# Patient Record
Sex: Male | Born: 1940 | Race: White | Hispanic: No | Marital: Married | Smoking: Former smoker
Health system: Southern US, Community
[De-identification: ages and names within clinical notes are randomized; demographics above are authoritative.]

## PROBLEM LIST (undated history)

## (undated) DIAGNOSIS — I10 Essential (primary) hypertension: Secondary | ICD-10-CM

## (undated) DIAGNOSIS — M8580 Other specified disorders of bone density and structure, unspecified site: Secondary | ICD-10-CM

## (undated) DIAGNOSIS — I2510 Atherosclerotic heart disease of native coronary artery without angina pectoris: Secondary | ICD-10-CM

## (undated) DIAGNOSIS — K219 Gastro-esophageal reflux disease without esophagitis: Secondary | ICD-10-CM

## (undated) DIAGNOSIS — E785 Hyperlipidemia, unspecified: Secondary | ICD-10-CM

## (undated) HISTORY — DX: Hyperlipidemia, unspecified: E78.5

## (undated) HISTORY — DX: Gastro-esophageal reflux disease without esophagitis: K21.9

## (undated) HISTORY — DX: Atherosclerotic heart disease of native coronary artery without angina pectoris: I25.10

## (undated) HISTORY — DX: Essential (primary) hypertension: I10

## (undated) HISTORY — PX: HERNIA REPAIR: SHX51

## (undated) HISTORY — DX: Other specified disorders of bone density and structure, unspecified site: M85.80

---

## 1999-10-18 ENCOUNTER — Ambulatory Visit (HOSPITAL_COMMUNITY): Admission: RE | Admit: 1999-10-18 | Discharge: 1999-10-18 | Payer: Self-pay | Admitting: Internal Medicine

## 1999-10-18 ENCOUNTER — Encounter: Payer: Self-pay | Admitting: Internal Medicine

## 1999-12-13 ENCOUNTER — Other Ambulatory Visit: Admission: RE | Admit: 1999-12-13 | Discharge: 1999-12-13 | Payer: Self-pay | Admitting: Gastroenterology

## 1999-12-13 ENCOUNTER — Encounter (INDEPENDENT_AMBULATORY_CARE_PROVIDER_SITE_OTHER): Payer: Self-pay

## 2003-04-28 ENCOUNTER — Ambulatory Visit (HOSPITAL_COMMUNITY): Admission: RE | Admit: 2003-04-28 | Discharge: 2003-04-28 | Payer: Self-pay | Admitting: *Deleted

## 2003-06-26 ENCOUNTER — Encounter: Payer: Self-pay | Admitting: General Surgery

## 2003-06-26 ENCOUNTER — Ambulatory Visit (HOSPITAL_COMMUNITY): Admission: RE | Admit: 2003-06-26 | Discharge: 2003-06-26 | Payer: Self-pay | Admitting: General Surgery

## 2005-03-01 ENCOUNTER — Encounter: Admission: RE | Admit: 2005-03-01 | Discharge: 2005-03-01 | Payer: Self-pay | Admitting: General Surgery

## 2005-03-03 ENCOUNTER — Encounter: Admission: RE | Admit: 2005-03-03 | Discharge: 2005-03-03 | Payer: Self-pay | Admitting: General Surgery

## 2005-03-07 ENCOUNTER — Ambulatory Visit (HOSPITAL_COMMUNITY): Admission: RE | Admit: 2005-03-07 | Discharge: 2005-03-07 | Payer: Self-pay | Admitting: General Surgery

## 2005-03-07 ENCOUNTER — Ambulatory Visit (HOSPITAL_BASED_OUTPATIENT_CLINIC_OR_DEPARTMENT_OTHER): Admission: RE | Admit: 2005-03-07 | Discharge: 2005-03-07 | Payer: Self-pay | Admitting: General Surgery

## 2009-11-02 ENCOUNTER — Encounter: Admission: RE | Admit: 2009-11-02 | Discharge: 2009-11-02 | Payer: Self-pay | Admitting: Internal Medicine

## 2009-12-09 ENCOUNTER — Encounter: Admission: RE | Admit: 2009-12-09 | Discharge: 2009-12-09 | Payer: Self-pay | Admitting: Endocrinology

## 2009-12-09 ENCOUNTER — Other Ambulatory Visit: Admission: RE | Admit: 2009-12-09 | Discharge: 2009-12-09 | Payer: Self-pay | Admitting: Diagnostic Radiology

## 2011-02-18 NOTE — Op Note (Signed)
   NAMEKOLTON, Kevin Le                        ACCOUNT NO.:  [id number]   MEDICAL RECORD NO.:  [id number]                   PATIENT TYPE:  AMB   LOCATION:  ENDO                                 FACILITY:  [facility]   PHYSICIAN:  Georgiana Spinner, M.D.                 DATE OF BIRTH:  01/21/1941   DATE OF PROCEDURE:  04/28/2003  DATE OF DISCHARGE:                                 OPERATIVE REPORT   PROCEDURE:  Colonoscopy.   INDICATIONS:  Colon cancer screening.   ANESTHESIA:  None further given.   DESCRIPTION OF PROCEDURE:  With the patient mildly sedated in the left  lateral decubitus position, the Olympus videoscopic colonoscope was inserted  in the rectum after a normal rectal exam and passed under direct vision to  the cecum, identified by ileocecal valve and appendiceal orifice, both of  which were photographed.  From this point the colonoscope was slowly  withdrawn, taking circumferential views of the entire colonic mucosa,  stopping only in the rectum, which appeared normal on direct and retroflexed  view.  The endoscope was straightened and withdrawn.  The patient's vital  signs and pulse oximetry remained stable.  The patient tolerated the  procedure well without apparent complications.   FINDINGS:  Unremarkable colonoscopic examination to the cecum.   PLAN:  Repeat examination in five to 10 years.                                               Georgiana Spinner, M.D.    GMO/MEDQ  D:  04/28/2003  T:  04/28/2003  Job:  [id number]

## 2011-02-18 NOTE — Op Note (Signed)
Kevin Le, Kevin Le                        ACCOUNT NO.:  [id number]   MEDICAL RECORD NO.:  [id number]                   PATIENT TYPE:  AMB   LOCATION:  DAY                                  FACILITY:  [facility]   PHYSICIAN:  Adolph Pollack, M.D.            DATE OF BIRTH:  [birth year]   DATE OF PROCEDURE:  06/26/2003  DATE OF DISCHARGE:                                 OPERATIVE REPORT   PREOPERATIVE DIAGNOSIS:  Bilateral recurrent inguinal hernias.   POSTOPERATIVE DIAGNOSIS:  Bilateral recurrent inguinal hernias.   PROCEDURE:  Laparoscopic repair of bilateral recurrent inguinal hernias with  mesh.   SURGEON:  Adolph Pollack, M.D.   ASSISTANT:  Anselm Pancoast. Zachery Dakins, M.D.   ANESTHESIA:  General.   INDICATION:  Kevin Le is a 70 year old male, who on separate occasions  has had both right and left inguinal hernias repaired.  He has been having  some left-sided bulge and also now right-sided stinging and swelling.  On  exam, he has bilateral recurrent hernias and now presents for laparoscopic  repair.  The procedure and the risks were discussed with him preoperatively.   TECHNIQUE:  He is seen in the holding area, then brought to the operating  room, placed supine on the operating table, and a general anesthetic was  administered.  A Foley catheter was placed in the bladder.  The groin and  lower abdomen was shaved then sterilely prepped and draped.  Local  anesthetic consisting of dilute Marcaine was infiltrated in the subumbilical  region, and a small transverse subumbilical incision made through the skin  and subcutaneous tissue.  Using blunt dissection, I then identified the left  anterior rectus sheath and made a small incision it.  The underlying left  rectus muscle was then swept laterally, exposing the posterior rectus  sheath.  A balloon dissection device was then placed in the extraperitoneal  space under laparoscopic vision.  Balloon dissection was performed  adequately.  Once this was completed, the balloon was removed, and a trocar  was placed into the extraperitoneal space.  CO2 gas was then insufflated  into the extraperitoneal space, creating a working area.  Under laparoscopic  vision, two 5 mm trocars were placed in the lower midline.  I began on the  left side by identifying Cooper's ligament and the direct space which  appeared to be solid.  I dissected fibrofatty attachments from the anterior  and lateral abdominal wall to the level of the umbilicus.  I then identified  the spermatic cord and noticed an indirect sac and a defect in the internal  ring.  Using careful blunt dissection, I dissected the sac away from the  cord back to the level of the umbilicus, exposing the internal ring defect.   Next, I approached the right extraperitoneal space.  I isolated Cooper's  ligament using blunt dissection as well and then identified the indirect  space which appeared  to be solid.  I then used blunt dissection to dissect  the fibrofatty attachments away from the lateral and anterior abdominal  walls up to the level of the umbilicus.  I identified the spermatic cord and  noticed again another indirect sac going up through a small internal ring  defect.  Using careful blunt dissection, I was able to free the sac up from  the cord and dissect it back to the level of the umbilicus.  Following this,  I placed a piece of 5 inch x 6 inch polypropylene mesh with a partial  longitudinal slit cut into it into the right extraperitoneal space and  positioned it to the two tails were wrapped around the cord.  I then  anchored the mesh to Cooper's ligament and the anterior and lateral  abdominal wall with spiral tacks.  I crossed the two tails around the cord,  creating a new internal ring.  This provided for more than adequate coverage  of the direct, indirect, and femoral spaces.   In a similar fashion, I placed a piece of 5 x 6 inch polypropylene mesh  with  a partial longitudinal slit cut in, into the left extraperitoneal space and  positioned it.  I then anchored it to Cooper's ligament, the anterior and  lateral abdominal wall with the spiral tacker.  I then crossed the two tails  and actually used one tack to anchor the two tails to each other and to the  anterior abdominal wall.  This again provided for more than adequate  coverage of the direct, indirect, and femoral spaces.   Following this, hemostasis appeared to be adequate.  I used the blunt  instrument to hold pressure on the inferolateral aspect of each piece of  mesh, then released the CO2 gas from the extraperitoneal space.  I then  removed the instruments and trocars.   I repaired the left anterior rectus fascial defect with interrupted 0 Vicryl  sutures.  The skin incisions were closed with 4-0 Monocryl subcuticular  stitches.  Steri-Strips and sterile dressings were applied.   He tolerated the procedure well without any apparent complications.  He was  subsequently taken to the recovery room in satisfactory condition.  He will  be given discharge instructions along with pain medicine, and he will be  discharged home today.                                               Adolph Pollack, M.D.    Kari Baars  D:  06/26/2003  T:  06/26/2003  Job:  [id number]   cc:   Janae Bridgeman. Eloise Harman., M.D.  [address]  [place]  [place] [address]  Fax: [phone]

## 2011-02-18 NOTE — Op Note (Signed)
   [name], Kevin Le                        ACCOUNT NO.:  [id number]   MEDICAL RECORD NO.:  [id number]                   PATIENT TYPE:  AMB   LOCATION:  ENDO                                 FACILITY:  [facility]   PHYSICIAN:  Georgiana Spinner, M.D.                 DATE OF BIRTH:  30-Dec-1940   DATE OF PROCEDURE:  DATE OF DISCHARGE:                                 OPERATIVE REPORT   PROCEDURE:  Endoscopy.   INDICATIONS FOR PROCEDURE:  Gastroesophageal reflux disease.   ANESTHESIA:  Demerol 50, Versed 4 mg.   DESCRIPTION OF PROCEDURE:  With the patient mildly sedated in the left  lateral decubitus position, the Olympus videoscopic endoscope was inserted  in the mouth and passed under direct vision through the esophagus which  appeared normal until we reached the distal esophagus which appeared  somewhat inflamed, photographs were taken. The stomach, fundus, body,  antrum, duodenal bulb and second portion of the duodenum were visualized and  from this point, the endoscope was slowly withdrawn taking circumferential  views of the duodenal mucosa until the endoscope was then pulled back into  the stomach, placed in retroflexion to view the stomach from below and an  incomplete wrap of the GE junction around the endoscope was noted and  photographed. The endoscope was straightened and withdrawn taking  circumferential views of the remaining gastric and esophageal mucosa. The  patient's vital signs and pulse oximeter remained stable. The patient  tolerated the procedure well without apparent complications.   FINDINGS:  Inflammatory changes of the GE junction, no evidence of Barrett's  seen. Some blood streaking in the stomach was noted presumably from his  inflamed esophageal junction.   PLAN:  Treat reflux as needed and proceed to colonoscopy as planned.                                               Georgiana Spinner, M.D.    GMO/MEDQ  D:  04/28/2003  T:  04/28/2003  Job:  [id number]

## 2011-02-18 NOTE — Op Note (Signed)
NAMEKOHEN, REITHER              ACCOUNT NO.:  [id number]   MEDICAL RECORD NO.:  [id number]          PATIENT TYPE:  AMB   LOCATION:  [facility]                         FACILITY:  [facility]   PHYSICIAN:  Adolph Pollack, M.D.DATE OF BIRTH:  1941/06/07   DATE OF PROCEDURE:  03/07/2005  DATE OF DISCHARGE:                                 OPERATIVE REPORT   PREOPERATIVE DIAGNOSIS:  Recurrent left inguinal hernia.   POSTOPERATIVE DIAGNOSIS:  Recurrent left inguinal hernia.   PROCEDURE:  Repair of recurrent left inguinal hernia with plug and mesh.   SURGEON:  Adolph Pollack, M.D.   ANESTHESIA:  General with local 0.5% plain Marcaine.   INDICATIONS FOR PROCEDURE:  Kevin Le is a 70 year old male.  On June 26, 2003, he underwent laparoscopic bilateral recurrent inguinal herniae  repair with mesh.  He was back to work within five days.  About 3-4 months  ago, he noticed a little bubble in the lateral aspect of the previous left  groin incision that he has had to push back in.  He does hear some gurgling.  On exam, he does have a recurrent left inguinal hernia and he now presents  for repair.  The procedure and risks were discussed with him preoperatively.  The importance of very light activity postoperatively was also discussed  with him.   SURGICAL TECHNIQUE:  He is seen in the holding area and the left groin  marked with my initials.  He is then brought to the operating room and  placed supine on the operating table and general anesthetic was  administered.  The hair on the left groin area was clipped.  The area was  sterilely prepped and draped.  A local anesthetic was infiltrated  superficially and deep in the are of the previous incision and I partially  excised part of the previous incision.  The subcutaneous tissue was divided  sharply down to what was attenuated external oblique aponeurosis.  More  local anesthetic was infiltrated deep to this and I divided this sharply.   I  then used sharp dissection to separate the internal oblique muscle and  aponeurosis superiorly from it.  Inferiorly, an attenuated shelving edge of  the inguinal ligament was identified.  I isolated the spermatic cord using  blunt dissection.  I could feel the defect in the internal ring area.   I carefully separated the hernia sac from the spermatic cord and reduced it  back through a patulous internal ring.  I could feel the previous mesh  repair and it appeared to furl up and thus leave this gap.  I subsequently  took a large plug and retracted the cord medially and then placed a plug  through the patulous internal ring defect and anchored it to the underlying  transversalis and internal oblique muscle with 2-0 Prolene suture.  I then  brought a 3 by 6 inch piece of polypropylene mesh onto the field and  anchored it to the pubic tubercle with a 2-0 Prolene suture.  The inferior  aspect of the mesh was then anchored to the attenuated shelving edge  of the  inguinal ligament and a larger part of the inguinal ligament, as well, with  a running 2-0 Prolene suture up to the level 1 cm lateral to the internal  ring.  A slit was cut in the mesh, two tails were wrapped around the cord.  The superior aspect of the mesh was then anchored to the internal oblique  muscle and aponeurosis with interrupted 2-0 Vicryl sutures.  I then crossed  the two tails of the mesh creating a new internal ring and anchored these to  the attenuated shelving edge of the inguinal ligament and to some of the  stronger part of the inguinal ligament with a 2-0 Prolene suture.  The tip  of the hemostat was able to be placed through the new aperture.   Following this, the lateral aspects of the mesh were then tucked deep deep  to the external oblique aponeurosis.  Hemostasis was adequate.  I  approximated the attenuated external oblique aponeurosis over the mesh and  cord with a running 3-0 Vicryl suture.  Scarpa's  fascia was reapproximated  with running 2-0 Vicryl suture.  The skin was closed with running 4-0  Monocryl subcuticular stitch.  Steri-Strips and a sterile dressing were  applied.  He tolerated the procedure well without any apparent complication.  The left testicle was in its normal position in the scrotum.  He was  subsequently taken to the recovery room in satisfactory condition.  He will  be given discharge instructions and Tylox for pain and follow up in the  office in 2-3 weeks.      TJR/MEDQ  D:  03/07/2005  T:  03/07/2005  Job:  [id number]   cc:   Janae Bridgeman. Eloise Harman., M.D.  [address]  [place]  [place] [address]  Fax: [phone]

## 2011-10-31 DIAGNOSIS — I10 Essential (primary) hypertension: Secondary | ICD-10-CM | POA: Diagnosis not present

## 2011-10-31 DIAGNOSIS — M533 Sacrococcygeal disorders, not elsewhere classified: Secondary | ICD-10-CM | POA: Diagnosis not present

## 2011-12-22 IMAGING — US US AORTA SCREENING (MEDICARE)
1 series · 12 of 12 positions shown · non-contrast
Comparison: None.

CLINICAL DATA: Screening AAA.

ABDOMINAL AORTA SCREENING ULTRASOUND
TECHNIQUE: Ultrasound examination of the abdominal aorta was
performed as a screening evaluation for abdominal aortic aneurysm.
The proximal iliac arteries were also evaluated bilaterally.

[Series 1: us aorta screening (medicare) · 0.28mm/px · 12 of 12 slices shown]
[im 1/12]
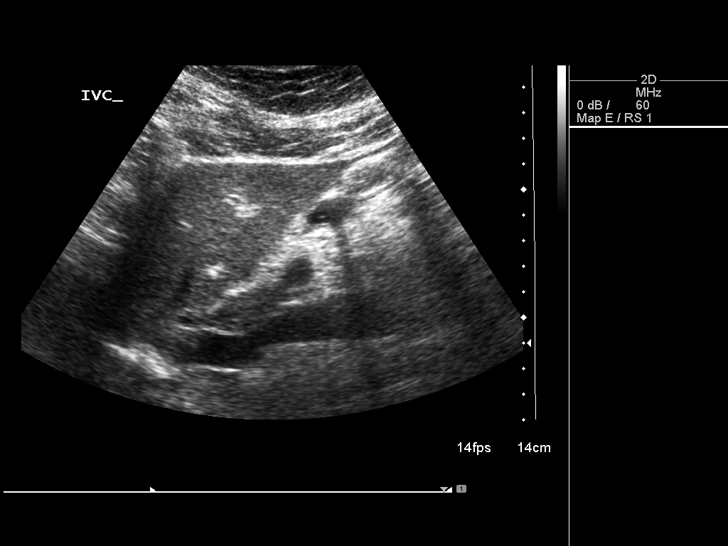
[im 2/12]
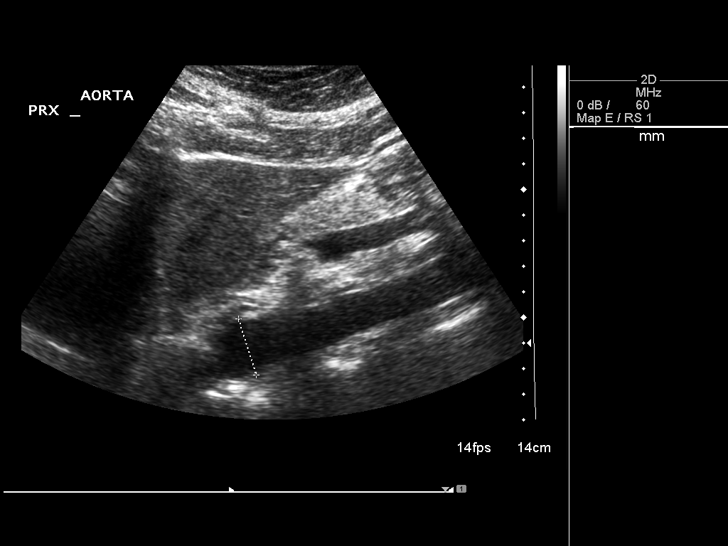
[im 3/12]
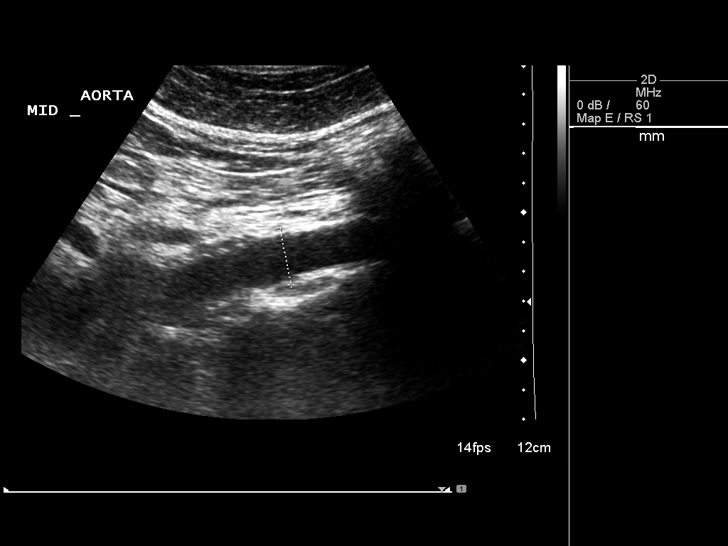
[im 4/12]
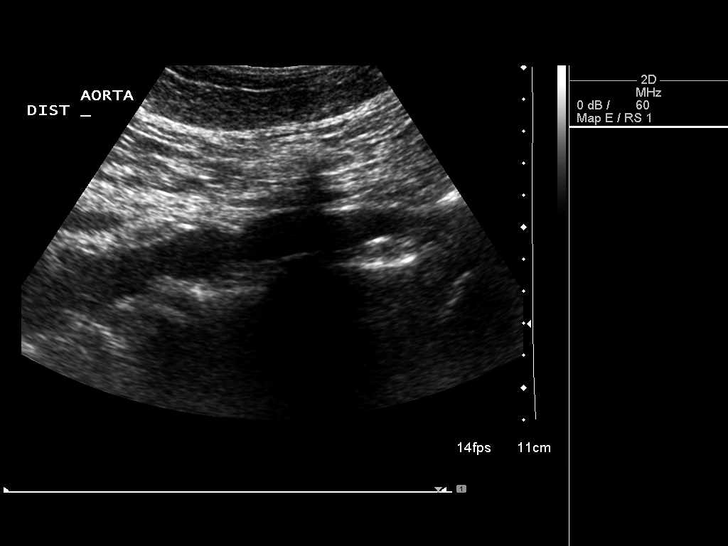
[im 5/12]
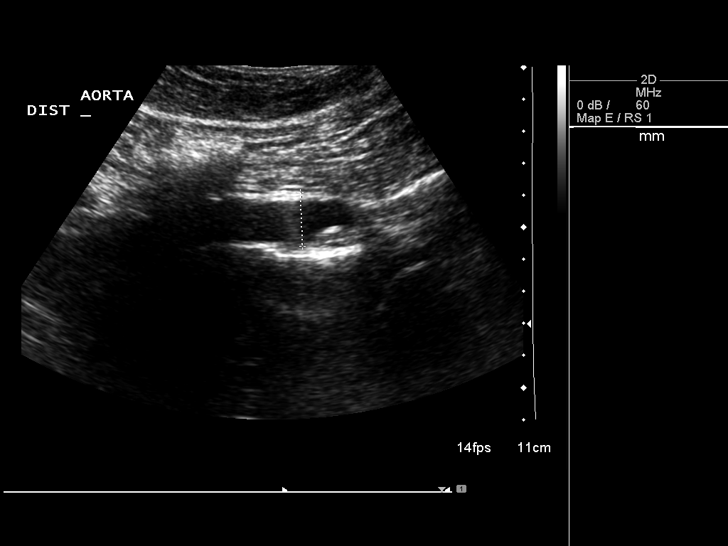
[im 6/12]
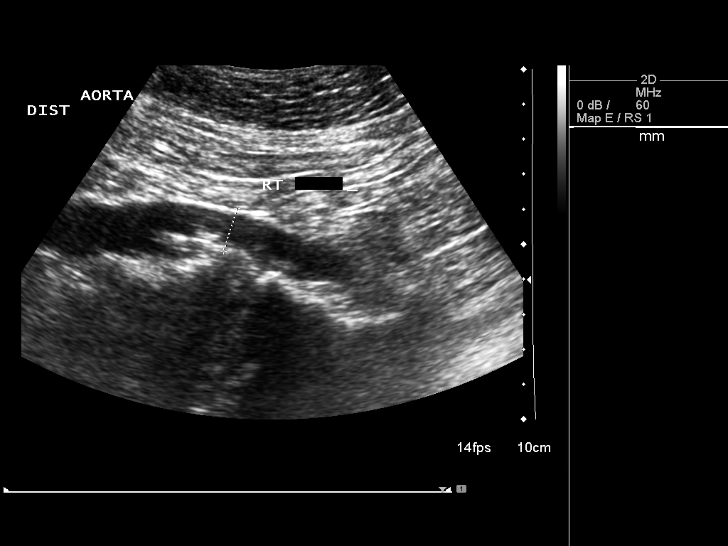
[im 7/12]
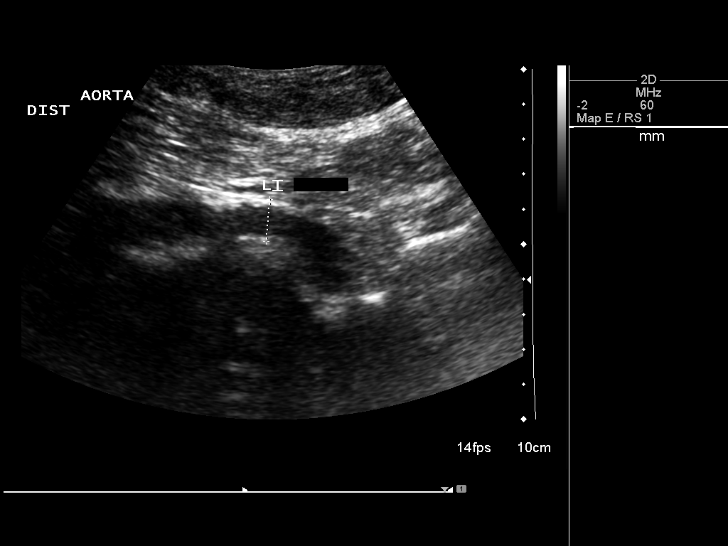
[im 8/12]
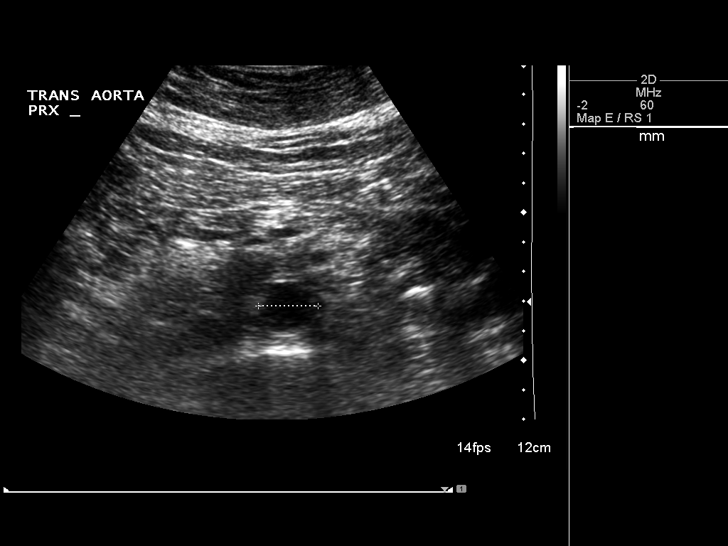
[im 9/12]
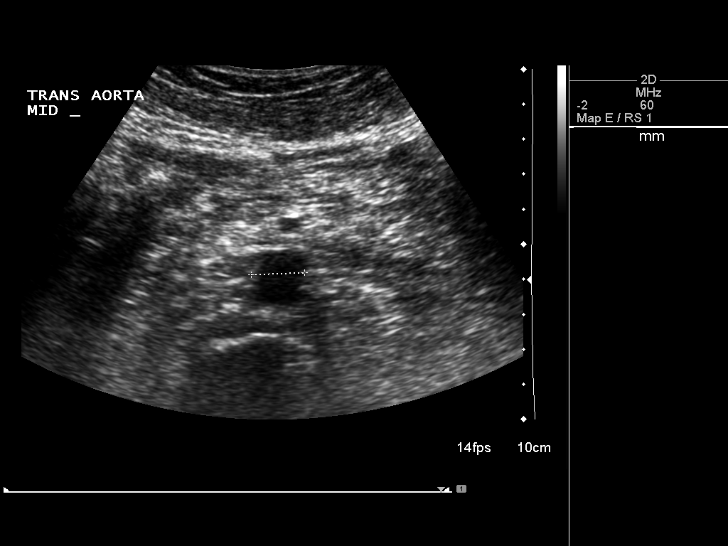
[im 10/12]
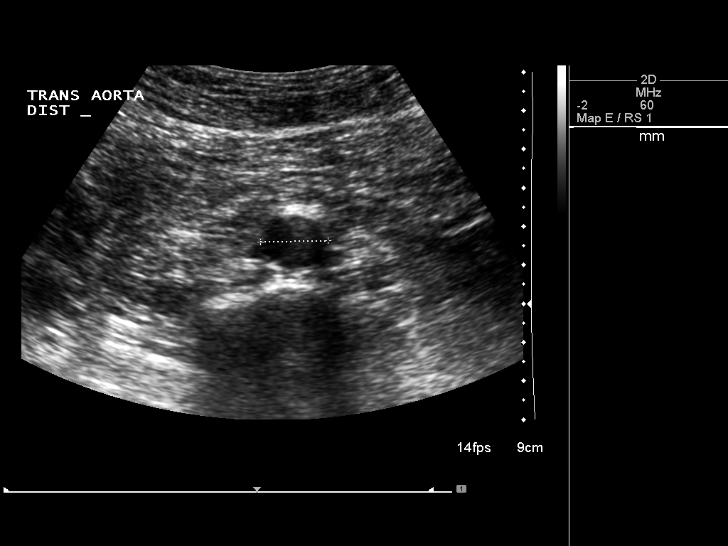
[im 11/12]
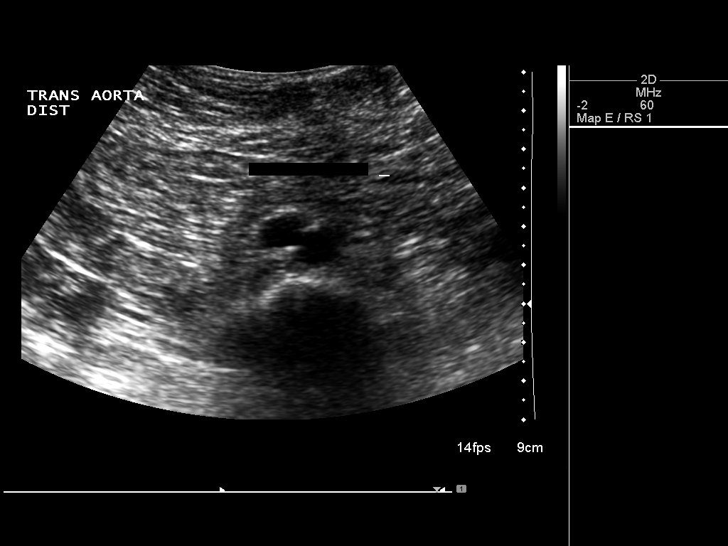
[im 12/12]
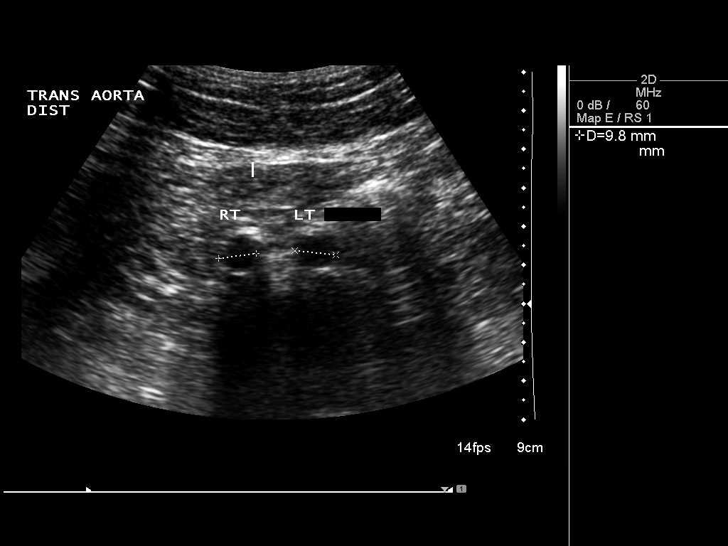

[12 of 12 positions shown; findings below may reference images not displayed]

FINDINGS: Abdominal aorta measures up to 2.3 x 2.0 cm.  There is
scattered atherosclerotic irregularity along the wall of the aorta.
Right common iliac artery measures 1.3 cm and left common iliac
artery, 1.2 cm.
IMPRESSION: No evidence of AAA.

## 2012-01-09 DIAGNOSIS — B353 Tinea pedis: Secondary | ICD-10-CM | POA: Diagnosis not present

## 2012-05-14 DIAGNOSIS — M272 Inflammatory conditions of jaws: Secondary | ICD-10-CM | POA: Diagnosis not present

## 2012-05-23 DIAGNOSIS — Z79899 Other long term (current) drug therapy: Secondary | ICD-10-CM | POA: Diagnosis not present

## 2012-05-23 DIAGNOSIS — I10 Essential (primary) hypertension: Secondary | ICD-10-CM | POA: Diagnosis not present

## 2012-05-23 DIAGNOSIS — M949 Disorder of cartilage, unspecified: Secondary | ICD-10-CM | POA: Diagnosis not present

## 2012-05-23 DIAGNOSIS — Z125 Encounter for screening for malignant neoplasm of prostate: Secondary | ICD-10-CM | POA: Diagnosis not present

## 2012-05-23 DIAGNOSIS — Z0000 Encounter for general adult medical examination without abnormal findings: Secondary | ICD-10-CM | POA: Diagnosis not present

## 2012-05-23 DIAGNOSIS — N390 Urinary tract infection, site not specified: Secondary | ICD-10-CM | POA: Diagnosis not present

## 2012-05-28 ENCOUNTER — Other Ambulatory Visit: Payer: Self-pay | Admitting: Internal Medicine

## 2012-05-28 DIAGNOSIS — N289 Disorder of kidney and ureter, unspecified: Secondary | ICD-10-CM | POA: Diagnosis not present

## 2012-05-28 DIAGNOSIS — E041 Nontoxic single thyroid nodule: Secondary | ICD-10-CM

## 2012-05-28 DIAGNOSIS — M533 Sacrococcygeal disorders, not elsewhere classified: Secondary | ICD-10-CM | POA: Diagnosis not present

## 2012-05-28 DIAGNOSIS — I10 Essential (primary) hypertension: Secondary | ICD-10-CM | POA: Diagnosis not present

## 2012-05-28 DIAGNOSIS — Z0000 Encounter for general adult medical examination without abnormal findings: Secondary | ICD-10-CM | POA: Diagnosis not present

## 2012-06-11 ENCOUNTER — Ambulatory Visit
Admission: RE | Admit: 2012-06-11 | Discharge: 2012-06-11 | Disposition: A | Discharge status: Home / Self Care | Payer: Medicare Other | Source: Ambulatory Visit | Attending: Internal Medicine | Admitting: Internal Medicine

## 2012-06-11 DIAGNOSIS — E041 Nontoxic single thyroid nodule: Secondary | ICD-10-CM | POA: Diagnosis not present

## 2012-07-02 DIAGNOSIS — Z23 Encounter for immunization: Secondary | ICD-10-CM | POA: Diagnosis not present

## 2012-07-10 DIAGNOSIS — H33009 Unspecified retinal detachment with retinal break, unspecified eye: Secondary | ICD-10-CM | POA: Diagnosis not present

## 2012-07-10 DIAGNOSIS — H33319 Horseshoe tear of retina without detachment, unspecified eye: Secondary | ICD-10-CM | POA: Diagnosis not present

## 2012-08-06 DIAGNOSIS — Z1211 Encounter for screening for malignant neoplasm of colon: Secondary | ICD-10-CM | POA: Diagnosis not present

## 2012-10-15 DIAGNOSIS — M810 Age-related osteoporosis without current pathological fracture: Secondary | ICD-10-CM | POA: Diagnosis not present

## 2013-06-05 DIAGNOSIS — Z79899 Other long term (current) drug therapy: Secondary | ICD-10-CM | POA: Diagnosis not present

## 2013-06-05 DIAGNOSIS — Z0000 Encounter for general adult medical examination without abnormal findings: Secondary | ICD-10-CM | POA: Diagnosis not present

## 2013-06-05 DIAGNOSIS — Z125 Encounter for screening for malignant neoplasm of prostate: Secondary | ICD-10-CM | POA: Diagnosis not present

## 2013-06-05 DIAGNOSIS — I10 Essential (primary) hypertension: Secondary | ICD-10-CM | POA: Diagnosis not present

## 2013-06-05 DIAGNOSIS — M899 Disorder of bone, unspecified: Secondary | ICD-10-CM | POA: Diagnosis not present

## 2013-06-05 DIAGNOSIS — Z23 Encounter for immunization: Secondary | ICD-10-CM | POA: Diagnosis not present

## 2013-06-05 DIAGNOSIS — E7800 Pure hypercholesterolemia, unspecified: Secondary | ICD-10-CM | POA: Diagnosis not present

## 2013-06-10 DIAGNOSIS — M899 Disorder of bone, unspecified: Secondary | ICD-10-CM | POA: Diagnosis not present

## 2013-06-10 DIAGNOSIS — I10 Essential (primary) hypertension: Secondary | ICD-10-CM | POA: Diagnosis not present

## 2013-06-10 DIAGNOSIS — Z1212 Encounter for screening for malignant neoplasm of rectum: Secondary | ICD-10-CM | POA: Diagnosis not present

## 2013-06-10 DIAGNOSIS — E785 Hyperlipidemia, unspecified: Secondary | ICD-10-CM | POA: Diagnosis not present

## 2013-06-10 DIAGNOSIS — E7800 Pure hypercholesterolemia, unspecified: Secondary | ICD-10-CM | POA: Diagnosis not present

## 2013-06-10 DIAGNOSIS — K219 Gastro-esophageal reflux disease without esophagitis: Secondary | ICD-10-CM | POA: Diagnosis not present

## 2013-06-10 DIAGNOSIS — Z79899 Other long term (current) drug therapy: Secondary | ICD-10-CM | POA: Diagnosis not present

## 2013-06-10 DIAGNOSIS — Z006 Encounter for examination for normal comparison and control in clinical research program: Secondary | ICD-10-CM | POA: Diagnosis not present

## 2013-06-10 DIAGNOSIS — Z23 Encounter for immunization: Secondary | ICD-10-CM | POA: Diagnosis not present

## 2013-09-06 DIAGNOSIS — H33009 Unspecified retinal detachment with retinal break, unspecified eye: Secondary | ICD-10-CM | POA: Diagnosis not present

## 2013-09-06 DIAGNOSIS — H33319 Horseshoe tear of retina without detachment, unspecified eye: Secondary | ICD-10-CM | POA: Diagnosis not present

## 2014-01-20 ENCOUNTER — Encounter: Payer: Self-pay | Admitting: Podiatry

## 2014-01-20 ENCOUNTER — Ambulatory Visit (INDEPENDENT_AMBULATORY_CARE_PROVIDER_SITE_OTHER): Payer: Medicare Other | Admitting: Podiatry

## 2014-01-20 VITALS — BP 123/81 | HR 76 | Resp 18

## 2014-01-20 DIAGNOSIS — M2040 Other hammer toe(s) (acquired), unspecified foot: Secondary | ICD-10-CM

## 2014-01-20 DIAGNOSIS — L84 Corns and callosities: Secondary | ICD-10-CM

## 2014-01-20 NOTE — Progress Notes (Signed)
° °  Subjective:    Patient ID: Kevin Le, male    DOB: [birth year], 73 y.o.   MRN: [id number]  HPI the 5th toe on my right foot has been going on for about a year and burns and gets white and hurts with shoes  This patient has been self treating a painful skin lesion in the fourth right with space area for approximately 1 year with over-the-counter medications. The pain seems to be increasing over time.    Review of Systems  All other systems reviewed and are negative.      Objective:   Physical Exam  Orientated x66 73 year old white male who is working full-time as a Art gallery manager.  Vascular: DP and PT pulses 2/4 bilaterally  Neurological: Sensation to 10 g monofilament wire intact 5/5 bilaterally. Ankle reflexes reactive bilaterally. Vibratory sensation intact bilaterally.  Dermatological: A macerated hyperkeratotic lesion noted in the fourth right with space area which is the area of concern. Varicose veins noted in the ankles bilaterally.  Musculoskeletal: Varus rotation of the fourth and fifth digits noted bilaterally.        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 He'll continue to apply topical antibiotic ointment until a scab forms. He will then insert a gel toe separator that was dispensed today between the fourth and fifth right toes, and wear wide toe box shoe.  He is made aware of the symptoms do not improve with local care to return for surgical evaluation.  Reappoint at patient's request

## 2014-01-20 NOTE — Patient Instructions (Signed)
Apply topical antibiotic ointment daily to the fourth right with space until the skin forms. Insert the toe separator after the skin forms. Wear wide soft shoe.  Return for further evaluation if symptoms persist over time.

## 2014-01-27 ENCOUNTER — Ambulatory Visit: Payer: Self-pay | Admitting: Podiatry

## 2014-06-26 DIAGNOSIS — Z23 Encounter for immunization: Secondary | ICD-10-CM | POA: Diagnosis not present

## 2014-06-26 DIAGNOSIS — Z0000 Encounter for general adult medical examination without abnormal findings: Secondary | ICD-10-CM | POA: Diagnosis not present

## 2014-06-26 DIAGNOSIS — E559 Vitamin D deficiency, unspecified: Secondary | ICD-10-CM | POA: Diagnosis not present

## 2014-06-26 DIAGNOSIS — I10 Essential (primary) hypertension: Secondary | ICD-10-CM | POA: Diagnosis not present

## 2014-06-26 DIAGNOSIS — Z125 Encounter for screening for malignant neoplasm of prostate: Secondary | ICD-10-CM | POA: Diagnosis not present

## 2014-06-26 DIAGNOSIS — E785 Hyperlipidemia, unspecified: Secondary | ICD-10-CM | POA: Diagnosis not present

## 2014-06-30 DIAGNOSIS — M949 Disorder of cartilage, unspecified: Secondary | ICD-10-CM | POA: Diagnosis not present

## 2014-06-30 DIAGNOSIS — I10 Essential (primary) hypertension: Secondary | ICD-10-CM | POA: Diagnosis not present

## 2014-06-30 DIAGNOSIS — M899 Disorder of bone, unspecified: Secondary | ICD-10-CM | POA: Diagnosis not present

## 2014-06-30 DIAGNOSIS — Z1212 Encounter for screening for malignant neoplasm of rectum: Secondary | ICD-10-CM | POA: Diagnosis not present

## 2014-06-30 DIAGNOSIS — K219 Gastro-esophageal reflux disease without esophagitis: Secondary | ICD-10-CM | POA: Diagnosis not present

## 2014-06-30 DIAGNOSIS — E785 Hyperlipidemia, unspecified: Secondary | ICD-10-CM | POA: Diagnosis not present

## 2014-10-20 DIAGNOSIS — M810 Age-related osteoporosis without current pathological fracture: Secondary | ICD-10-CM | POA: Diagnosis not present

## 2015-04-27 DIAGNOSIS — H33303 Unspecified retinal break, bilateral: Secondary | ICD-10-CM | POA: Diagnosis not present

## 2015-04-27 DIAGNOSIS — H5203 Hypermetropia, bilateral: Secondary | ICD-10-CM | POA: Diagnosis not present

## 2015-04-27 DIAGNOSIS — H2513 Age-related nuclear cataract, bilateral: Secondary | ICD-10-CM | POA: Diagnosis not present

## 2015-07-20 DIAGNOSIS — Z23 Encounter for immunization: Secondary | ICD-10-CM | POA: Diagnosis not present

## 2015-07-27 DIAGNOSIS — E7800 Pure hypercholesterolemia, unspecified: Secondary | ICD-10-CM | POA: Diagnosis not present

## 2015-07-27 DIAGNOSIS — Z125 Encounter for screening for malignant neoplasm of prostate: Secondary | ICD-10-CM | POA: Diagnosis not present

## 2015-07-27 DIAGNOSIS — Z0000 Encounter for general adult medical examination without abnormal findings: Secondary | ICD-10-CM | POA: Diagnosis not present

## 2015-07-27 DIAGNOSIS — E559 Vitamin D deficiency, unspecified: Secondary | ICD-10-CM | POA: Diagnosis not present

## 2015-07-27 DIAGNOSIS — I10 Essential (primary) hypertension: Secondary | ICD-10-CM | POA: Diagnosis not present

## 2015-07-27 DIAGNOSIS — M8580 Other specified disorders of bone density and structure, unspecified site: Secondary | ICD-10-CM | POA: Diagnosis not present

## 2015-08-03 DIAGNOSIS — I10 Essential (primary) hypertension: Secondary | ICD-10-CM | POA: Diagnosis not present

## 2015-08-03 DIAGNOSIS — M8580 Other specified disorders of bone density and structure, unspecified site: Secondary | ICD-10-CM | POA: Diagnosis not present

## 2015-08-03 DIAGNOSIS — N401 Enlarged prostate with lower urinary tract symptoms: Secondary | ICD-10-CM | POA: Diagnosis not present

## 2015-08-03 DIAGNOSIS — Z1212 Encounter for screening for malignant neoplasm of rectum: Secondary | ICD-10-CM | POA: Diagnosis not present

## 2015-08-03 DIAGNOSIS — N183 Chronic kidney disease, stage 3 (moderate): Secondary | ICD-10-CM | POA: Diagnosis not present

## 2015-08-25 DIAGNOSIS — B029 Zoster without complications: Secondary | ICD-10-CM | POA: Diagnosis not present

## 2016-05-02 DIAGNOSIS — H2513 Age-related nuclear cataract, bilateral: Secondary | ICD-10-CM | POA: Diagnosis not present

## 2016-05-02 DIAGNOSIS — H33303 Unspecified retinal break, bilateral: Secondary | ICD-10-CM | POA: Diagnosis not present

## 2016-05-30 DIAGNOSIS — L738 Other specified follicular disorders: Secondary | ICD-10-CM | POA: Diagnosis not present

## 2016-05-30 DIAGNOSIS — L821 Other seborrheic keratosis: Secondary | ICD-10-CM | POA: Diagnosis not present

## 2016-05-30 DIAGNOSIS — L400 Psoriasis vulgaris: Secondary | ICD-10-CM | POA: Diagnosis not present

## 2016-05-30 DIAGNOSIS — D1801 Hemangioma of skin and subcutaneous tissue: Secondary | ICD-10-CM | POA: Diagnosis not present

## 2016-05-31 ENCOUNTER — Other Ambulatory Visit: Payer: Self-pay

## 2016-07-04 DIAGNOSIS — Z23 Encounter for immunization: Secondary | ICD-10-CM | POA: Diagnosis not present

## 2016-08-08 DIAGNOSIS — I10 Essential (primary) hypertension: Secondary | ICD-10-CM | POA: Diagnosis not present

## 2016-08-08 DIAGNOSIS — N183 Chronic kidney disease, stage 3 (moderate): Secondary | ICD-10-CM | POA: Diagnosis not present

## 2016-08-08 DIAGNOSIS — Z125 Encounter for screening for malignant neoplasm of prostate: Secondary | ICD-10-CM | POA: Diagnosis not present

## 2016-08-08 DIAGNOSIS — J069 Acute upper respiratory infection, unspecified: Secondary | ICD-10-CM | POA: Diagnosis not present

## 2016-08-08 DIAGNOSIS — M8580 Other specified disorders of bone density and structure, unspecified site: Secondary | ICD-10-CM | POA: Diagnosis not present

## 2016-08-08 DIAGNOSIS — Z0000 Encounter for general adult medical examination without abnormal findings: Secondary | ICD-10-CM | POA: Diagnosis not present

## 2016-08-15 DIAGNOSIS — K219 Gastro-esophageal reflux disease without esophagitis: Secondary | ICD-10-CM | POA: Diagnosis not present

## 2016-08-15 DIAGNOSIS — E785 Hyperlipidemia, unspecified: Secondary | ICD-10-CM | POA: Diagnosis not present

## 2016-08-15 DIAGNOSIS — E041 Nontoxic single thyroid nodule: Secondary | ICD-10-CM | POA: Diagnosis not present

## 2016-08-15 DIAGNOSIS — L409 Psoriasis, unspecified: Secondary | ICD-10-CM | POA: Diagnosis not present

## 2016-10-31 DIAGNOSIS — R0989 Other specified symptoms and signs involving the circulatory and respiratory systems: Secondary | ICD-10-CM | POA: Diagnosis not present

## 2017-05-29 DIAGNOSIS — H5203 Hypermetropia, bilateral: Secondary | ICD-10-CM | POA: Diagnosis not present

## 2017-05-29 DIAGNOSIS — H33303 Unspecified retinal break, bilateral: Secondary | ICD-10-CM | POA: Diagnosis not present

## 2017-05-29 DIAGNOSIS — H2513 Age-related nuclear cataract, bilateral: Secondary | ICD-10-CM | POA: Diagnosis not present

## 2017-06-12 DIAGNOSIS — L821 Other seborrheic keratosis: Secondary | ICD-10-CM | POA: Diagnosis not present

## 2017-06-12 DIAGNOSIS — L570 Actinic keratosis: Secondary | ICD-10-CM | POA: Diagnosis not present

## 2017-06-12 DIAGNOSIS — D1801 Hemangioma of skin and subcutaneous tissue: Secondary | ICD-10-CM | POA: Diagnosis not present

## 2017-06-12 DIAGNOSIS — L400 Psoriasis vulgaris: Secondary | ICD-10-CM | POA: Diagnosis not present

## 2017-07-03 DIAGNOSIS — Z23 Encounter for immunization: Secondary | ICD-10-CM | POA: Diagnosis not present

## 2017-09-14 DIAGNOSIS — I10 Essential (primary) hypertension: Secondary | ICD-10-CM | POA: Diagnosis not present

## 2017-09-14 DIAGNOSIS — M8580 Other specified disorders of bone density and structure, unspecified site: Secondary | ICD-10-CM | POA: Diagnosis not present

## 2017-09-14 DIAGNOSIS — Z125 Encounter for screening for malignant neoplasm of prostate: Secondary | ICD-10-CM | POA: Diagnosis not present

## 2017-09-18 DIAGNOSIS — L409 Psoriasis, unspecified: Secondary | ICD-10-CM | POA: Diagnosis not present

## 2017-09-18 DIAGNOSIS — M8580 Other specified disorders of bone density and structure, unspecified site: Secondary | ICD-10-CM | POA: Diagnosis not present

## 2017-09-18 DIAGNOSIS — E785 Hyperlipidemia, unspecified: Secondary | ICD-10-CM | POA: Diagnosis not present

## 2017-09-18 DIAGNOSIS — M40203 Unspecified kyphosis, cervicothoracic region: Secondary | ICD-10-CM | POA: Diagnosis not present

## 2017-09-18 DIAGNOSIS — E559 Vitamin D deficiency, unspecified: Secondary | ICD-10-CM | POA: Diagnosis not present

## 2017-09-18 DIAGNOSIS — I8393 Asymptomatic varicose veins of bilateral lower extremities: Secondary | ICD-10-CM | POA: Diagnosis not present

## 2017-09-18 DIAGNOSIS — I10 Essential (primary) hypertension: Secondary | ICD-10-CM | POA: Diagnosis not present

## 2017-09-18 DIAGNOSIS — K219 Gastro-esophageal reflux disease without esophagitis: Secondary | ICD-10-CM | POA: Diagnosis not present

## 2017-09-18 DIAGNOSIS — E041 Nontoxic single thyroid nodule: Secondary | ICD-10-CM | POA: Diagnosis not present

## 2017-09-18 DIAGNOSIS — N183 Chronic kidney disease, stage 3 (moderate): Secondary | ICD-10-CM | POA: Diagnosis not present

## 2017-09-18 DIAGNOSIS — M2041 Other hammer toe(s) (acquired), right foot: Secondary | ICD-10-CM | POA: Diagnosis not present

## 2017-09-18 DIAGNOSIS — R0989 Other specified symptoms and signs involving the circulatory and respiratory systems: Secondary | ICD-10-CM | POA: Diagnosis not present

## 2017-09-18 DIAGNOSIS — M47814 Spondylosis without myelopathy or radiculopathy, thoracic region: Secondary | ICD-10-CM | POA: Diagnosis not present

## 2018-05-28 DIAGNOSIS — Z23 Encounter for immunization: Secondary | ICD-10-CM | POA: Diagnosis not present

## 2018-06-18 DIAGNOSIS — H5203 Hypermetropia, bilateral: Secondary | ICD-10-CM | POA: Diagnosis not present

## 2018-06-18 DIAGNOSIS — H31003 Unspecified chorioretinal scars, bilateral: Secondary | ICD-10-CM | POA: Diagnosis not present

## 2018-06-18 DIAGNOSIS — H2513 Age-related nuclear cataract, bilateral: Secondary | ICD-10-CM | POA: Diagnosis not present

## 2018-06-25 DIAGNOSIS — D1801 Hemangioma of skin and subcutaneous tissue: Secondary | ICD-10-CM | POA: Diagnosis not present

## 2018-06-25 DIAGNOSIS — L821 Other seborrheic keratosis: Secondary | ICD-10-CM | POA: Diagnosis not present

## 2018-06-25 DIAGNOSIS — L400 Psoriasis vulgaris: Secondary | ICD-10-CM | POA: Diagnosis not present

## 2018-06-25 DIAGNOSIS — L812 Freckles: Secondary | ICD-10-CM | POA: Diagnosis not present

## 2018-06-25 DIAGNOSIS — L308 Other specified dermatitis: Secondary | ICD-10-CM | POA: Diagnosis not present

## 2018-06-25 DIAGNOSIS — L570 Actinic keratosis: Secondary | ICD-10-CM | POA: Diagnosis not present

## 2018-09-24 DIAGNOSIS — Z125 Encounter for screening for malignant neoplasm of prostate: Secondary | ICD-10-CM | POA: Diagnosis not present

## 2018-09-24 DIAGNOSIS — I10 Essential (primary) hypertension: Secondary | ICD-10-CM | POA: Diagnosis not present

## 2018-10-01 DIAGNOSIS — M2041 Other hammer toe(s) (acquired), right foot: Secondary | ICD-10-CM | POA: Diagnosis not present

## 2018-10-01 DIAGNOSIS — Z0000 Encounter for general adult medical examination without abnormal findings: Secondary | ICD-10-CM | POA: Diagnosis not present

## 2018-10-01 DIAGNOSIS — E785 Hyperlipidemia, unspecified: Secondary | ICD-10-CM | POA: Diagnosis not present

## 2018-10-01 DIAGNOSIS — K219 Gastro-esophageal reflux disease without esophagitis: Secondary | ICD-10-CM | POA: Diagnosis not present

## 2018-10-01 DIAGNOSIS — E041 Nontoxic single thyroid nodule: Secondary | ICD-10-CM | POA: Diagnosis not present

## 2018-10-01 DIAGNOSIS — M8580 Other specified disorders of bone density and structure, unspecified site: Secondary | ICD-10-CM | POA: Diagnosis not present

## 2018-10-01 DIAGNOSIS — I10 Essential (primary) hypertension: Secondary | ICD-10-CM | POA: Diagnosis not present

## 2018-10-15 DIAGNOSIS — H00014 Hordeolum externum left upper eyelid: Secondary | ICD-10-CM | POA: Diagnosis not present

## 2019-05-06 ENCOUNTER — Other Ambulatory Visit: Payer: Self-pay

## 2019-06-03 DIAGNOSIS — Z23 Encounter for immunization: Secondary | ICD-10-CM | POA: Diagnosis not present

## 2019-07-01 DIAGNOSIS — H2513 Age-related nuclear cataract, bilateral: Secondary | ICD-10-CM | POA: Diagnosis not present

## 2019-07-01 DIAGNOSIS — H524 Presbyopia: Secondary | ICD-10-CM | POA: Diagnosis not present

## 2019-07-01 DIAGNOSIS — H5203 Hypermetropia, bilateral: Secondary | ICD-10-CM | POA: Diagnosis not present

## 2019-07-08 DIAGNOSIS — L84 Corns and callosities: Secondary | ICD-10-CM | POA: Diagnosis not present

## 2019-07-08 DIAGNOSIS — L821 Other seborrheic keratosis: Secondary | ICD-10-CM | POA: Diagnosis not present

## 2019-07-08 DIAGNOSIS — D1801 Hemangioma of skin and subcutaneous tissue: Secondary | ICD-10-CM | POA: Diagnosis not present

## 2019-07-08 DIAGNOSIS — L570 Actinic keratosis: Secondary | ICD-10-CM | POA: Diagnosis not present

## 2019-07-08 DIAGNOSIS — L400 Psoriasis vulgaris: Secondary | ICD-10-CM | POA: Diagnosis not present

## 2019-10-07 DIAGNOSIS — Z20828 Contact with and (suspected) exposure to other viral communicable diseases: Secondary | ICD-10-CM | POA: Diagnosis not present

## 2019-10-21 DIAGNOSIS — N401 Enlarged prostate with lower urinary tract symptoms: Secondary | ICD-10-CM | POA: Diagnosis not present

## 2019-10-21 DIAGNOSIS — J310 Chronic rhinitis: Secondary | ICD-10-CM | POA: Diagnosis not present

## 2019-10-21 DIAGNOSIS — E7800 Pure hypercholesterolemia, unspecified: Secondary | ICD-10-CM | POA: Diagnosis not present

## 2019-10-21 DIAGNOSIS — E785 Hyperlipidemia, unspecified: Secondary | ICD-10-CM | POA: Diagnosis not present

## 2019-10-21 DIAGNOSIS — I8393 Asymptomatic varicose veins of bilateral lower extremities: Secondary | ICD-10-CM | POA: Diagnosis not present

## 2019-10-21 DIAGNOSIS — M8589 Other specified disorders of bone density and structure, multiple sites: Secondary | ICD-10-CM | POA: Diagnosis not present

## 2019-10-21 DIAGNOSIS — I10 Essential (primary) hypertension: Secondary | ICD-10-CM | POA: Diagnosis not present

## 2019-10-21 DIAGNOSIS — E041 Nontoxic single thyroid nodule: Secondary | ICD-10-CM | POA: Diagnosis not present

## 2019-10-21 DIAGNOSIS — M8580 Other specified disorders of bone density and structure, unspecified site: Secondary | ICD-10-CM | POA: Diagnosis not present

## 2019-10-21 DIAGNOSIS — Z125 Encounter for screening for malignant neoplasm of prostate: Secondary | ICD-10-CM | POA: Diagnosis not present

## 2019-10-21 DIAGNOSIS — K219 Gastro-esophageal reflux disease without esophagitis: Secondary | ICD-10-CM | POA: Diagnosis not present

## 2019-10-21 DIAGNOSIS — Z0000 Encounter for general adult medical examination without abnormal findings: Secondary | ICD-10-CM | POA: Diagnosis not present

## 2019-10-28 DIAGNOSIS — I10 Essential (primary) hypertension: Secondary | ICD-10-CM | POA: Diagnosis not present

## 2019-10-28 DIAGNOSIS — E875 Hyperkalemia: Secondary | ICD-10-CM | POA: Diagnosis not present

## 2019-10-28 DIAGNOSIS — Z6821 Body mass index (BMI) 21.0-21.9, adult: Secondary | ICD-10-CM | POA: Diagnosis not present

## 2019-10-28 DIAGNOSIS — Z8619 Personal history of other infectious and parasitic diseases: Secondary | ICD-10-CM | POA: Diagnosis not present

## 2019-10-28 DIAGNOSIS — E871 Hypo-osmolality and hyponatremia: Secondary | ICD-10-CM | POA: Diagnosis not present

## 2019-10-28 DIAGNOSIS — E041 Nontoxic single thyroid nodule: Secondary | ICD-10-CM | POA: Diagnosis not present

## 2019-11-13 DIAGNOSIS — E871 Hypo-osmolality and hyponatremia: Secondary | ICD-10-CM | POA: Diagnosis not present

## 2019-11-18 DIAGNOSIS — E871 Hypo-osmolality and hyponatremia: Secondary | ICD-10-CM | POA: Diagnosis not present

## 2019-11-18 DIAGNOSIS — Z6821 Body mass index (BMI) 21.0-21.9, adult: Secondary | ICD-10-CM | POA: Diagnosis not present

## 2019-11-18 DIAGNOSIS — E041 Nontoxic single thyroid nodule: Secondary | ICD-10-CM | POA: Diagnosis not present

## 2019-11-18 DIAGNOSIS — E875 Hyperkalemia: Secondary | ICD-10-CM | POA: Diagnosis not present

## 2019-11-18 DIAGNOSIS — Z8619 Personal history of other infectious and parasitic diseases: Secondary | ICD-10-CM | POA: Diagnosis not present

## 2019-11-18 DIAGNOSIS — I10 Essential (primary) hypertension: Secondary | ICD-10-CM | POA: Diagnosis not present

## 2019-12-25 DIAGNOSIS — E041 Nontoxic single thyroid nodule: Secondary | ICD-10-CM | POA: Diagnosis not present

## 2019-12-25 DIAGNOSIS — E875 Hyperkalemia: Secondary | ICD-10-CM | POA: Diagnosis not present

## 2019-12-30 DIAGNOSIS — E041 Nontoxic single thyroid nodule: Secondary | ICD-10-CM | POA: Diagnosis not present

## 2019-12-30 DIAGNOSIS — Z6821 Body mass index (BMI) 21.0-21.9, adult: Secondary | ICD-10-CM | POA: Diagnosis not present

## 2019-12-30 DIAGNOSIS — N289 Disorder of kidney and ureter, unspecified: Secondary | ICD-10-CM | POA: Diagnosis not present

## 2019-12-30 DIAGNOSIS — I10 Essential (primary) hypertension: Secondary | ICD-10-CM | POA: Diagnosis not present

## 2019-12-30 DIAGNOSIS — Z8619 Personal history of other infectious and parasitic diseases: Secondary | ICD-10-CM | POA: Diagnosis not present

## 2020-01-11 ENCOUNTER — Ambulatory Visit: Payer: Medicare Other | Attending: Internal Medicine

## 2020-01-11 DIAGNOSIS — Z23 Encounter for immunization: Secondary | ICD-10-CM

## 2020-01-11 NOTE — Progress Notes (Signed)
   Covid-19 Vaccination Clinic  Name:  Kevin Le    MRN: [id number] DOB: [birth year]  01/11/2020  Mr. Kevin Le was observed post Covid-19 immunization for 15 minutes without incident. He was provided with Vaccine Information Sheet and instruction to access the V-Safe system.   Mr. Kevin Le was instructed to call 911 with any severe reactions post vaccine: Marland Kitchen Difficulty breathing  . Swelling of face and throat  . A fast heartbeat  . A bad rash all over body  . Dizziness and weakness   Immunizations Administered    Name Date Dose VIS Date Route   Pfizer COVID-19 Vaccine 01/11/2020  2:53 PM 0.3 mL 09/13/2019 Intramuscular   Manufacturer: [place]   Lot: [id number]   [place]: [id number]

## 2020-01-20 DIAGNOSIS — N289 Disorder of kidney and ureter, unspecified: Secondary | ICD-10-CM | POA: Diagnosis not present

## 2020-02-03 ENCOUNTER — Ambulatory Visit: Payer: Medicare Other | Attending: Internal Medicine

## 2020-02-03 DIAGNOSIS — Z23 Encounter for immunization: Secondary | ICD-10-CM

## 2020-02-03 NOTE — Progress Notes (Signed)
   Covid-19 Vaccination Clinic  Name:  Kevin Le    MRN: [id number] DOB: September 05, 1941  02/03/2020  [name] was observed post Covid-19 immunization for 15 minutes without incident. He was provided with Vaccine Information Sheet and instruction to access the V-Safe system.   [name] was instructed to call 911 with any severe reactions post vaccine: Marland Kitchen Difficulty breathing  . Swelling of face and throat  . A fast heartbeat  . A bad rash all over body  . Dizziness and weakness   Immunizations Administered    Name Date Dose VIS Date Route   Pfizer COVID-19 Vaccine 02/03/2020 12:32 PM 0.3 mL 11/27/2018 Intramuscular   Manufacturer: [place]   Lot: [id number]   [place]: [id number]

## 2020-06-15 DIAGNOSIS — Z23 Encounter for immunization: Secondary | ICD-10-CM | POA: Diagnosis not present

## 2020-07-06 DIAGNOSIS — H5203 Hypermetropia, bilateral: Secondary | ICD-10-CM | POA: Diagnosis not present

## 2020-07-06 DIAGNOSIS — H31003 Unspecified chorioretinal scars, bilateral: Secondary | ICD-10-CM | POA: Diagnosis not present

## 2020-07-06 DIAGNOSIS — H2513 Age-related nuclear cataract, bilateral: Secondary | ICD-10-CM | POA: Diagnosis not present

## 2020-07-13 DIAGNOSIS — L820 Inflamed seborrheic keratosis: Secondary | ICD-10-CM | POA: Diagnosis not present

## 2020-07-13 DIAGNOSIS — L821 Other seborrheic keratosis: Secondary | ICD-10-CM | POA: Diagnosis not present

## 2020-07-13 DIAGNOSIS — L400 Psoriasis vulgaris: Secondary | ICD-10-CM | POA: Diagnosis not present

## 2020-07-13 DIAGNOSIS — L603 Nail dystrophy: Secondary | ICD-10-CM | POA: Diagnosis not present

## 2020-08-07 DIAGNOSIS — Z23 Encounter for immunization: Secondary | ICD-10-CM | POA: Diagnosis not present

## 2020-10-22 DIAGNOSIS — I10 Essential (primary) hypertension: Secondary | ICD-10-CM | POA: Diagnosis not present

## 2020-10-22 DIAGNOSIS — Z125 Encounter for screening for malignant neoplasm of prostate: Secondary | ICD-10-CM | POA: Diagnosis not present

## 2020-10-26 DIAGNOSIS — N1831 Chronic kidney disease, stage 3a: Secondary | ICD-10-CM | POA: Diagnosis not present

## 2020-10-26 DIAGNOSIS — E7800 Pure hypercholesterolemia, unspecified: Secondary | ICD-10-CM | POA: Diagnosis not present

## 2020-10-26 DIAGNOSIS — M8580 Other specified disorders of bone density and structure, unspecified site: Secondary | ICD-10-CM | POA: Diagnosis not present

## 2020-10-26 DIAGNOSIS — E875 Hyperkalemia: Secondary | ICD-10-CM | POA: Diagnosis not present

## 2020-10-26 DIAGNOSIS — I10 Essential (primary) hypertension: Secondary | ICD-10-CM | POA: Diagnosis not present

## 2020-10-26 DIAGNOSIS — Z0001 Encounter for general adult medical examination with abnormal findings: Secondary | ICD-10-CM | POA: Diagnosis not present

## 2020-10-26 DIAGNOSIS — B351 Tinea unguium: Secondary | ICD-10-CM | POA: Diagnosis not present

## 2020-10-26 DIAGNOSIS — K219 Gastro-esophageal reflux disease without esophagitis: Secondary | ICD-10-CM | POA: Diagnosis not present

## 2020-10-29 ENCOUNTER — Other Ambulatory Visit: Payer: Self-pay | Admitting: Internal Medicine

## 2020-10-29 DIAGNOSIS — I10 Essential (primary) hypertension: Secondary | ICD-10-CM

## 2020-11-10 DIAGNOSIS — Z79899 Other long term (current) drug therapy: Secondary | ICD-10-CM | POA: Diagnosis not present

## 2020-11-13 ENCOUNTER — Other Ambulatory Visit: Payer: Medicare Other

## 2020-11-16 ENCOUNTER — Ambulatory Visit
Admission: RE | Admit: 2020-11-16 | Discharge: 2020-11-16 | Disposition: A | Discharge status: Home / Self Care | Payer: No Typology Code available for payment source | Source: Ambulatory Visit | Attending: Internal Medicine | Admitting: Internal Medicine

## 2020-11-16 DIAGNOSIS — E785 Hyperlipidemia, unspecified: Secondary | ICD-10-CM | POA: Diagnosis not present

## 2020-11-16 DIAGNOSIS — I700 Atherosclerosis of aorta: Secondary | ICD-10-CM | POA: Diagnosis not present

## 2020-11-16 DIAGNOSIS — I10 Essential (primary) hypertension: Secondary | ICD-10-CM

## 2020-11-19 DIAGNOSIS — E875 Hyperkalemia: Secondary | ICD-10-CM | POA: Diagnosis not present

## 2020-12-01 DIAGNOSIS — I2510 Atherosclerotic heart disease of native coronary artery without angina pectoris: Secondary | ICD-10-CM | POA: Diagnosis not present

## 2020-12-01 DIAGNOSIS — I10 Essential (primary) hypertension: Secondary | ICD-10-CM | POA: Diagnosis not present

## 2020-12-01 DIAGNOSIS — I2584 Coronary atherosclerosis due to calcified coronary lesion: Secondary | ICD-10-CM | POA: Diagnosis not present

## 2020-12-01 DIAGNOSIS — L270 Generalized skin eruption due to drugs and medicaments taken internally: Secondary | ICD-10-CM | POA: Diagnosis not present

## 2020-12-01 DIAGNOSIS — E785 Hyperlipidemia, unspecified: Secondary | ICD-10-CM | POA: Diagnosis not present

## 2020-12-02 DIAGNOSIS — Z03818 Encounter for observation for suspected exposure to other biological agents ruled out: Secondary | ICD-10-CM | POA: Diagnosis not present

## 2020-12-02 DIAGNOSIS — Z20822 Contact with and (suspected) exposure to covid-19: Secondary | ICD-10-CM | POA: Diagnosis not present

## 2020-12-23 ENCOUNTER — Ambulatory Visit: Payer: Medicare Other | Admitting: Cardiovascular Disease

## 2020-12-24 DIAGNOSIS — I10 Essential (primary) hypertension: Secondary | ICD-10-CM | POA: Diagnosis not present

## 2020-12-28 DIAGNOSIS — I10 Essential (primary) hypertension: Secondary | ICD-10-CM | POA: Diagnosis not present

## 2021-01-21 DIAGNOSIS — I10 Essential (primary) hypertension: Secondary | ICD-10-CM | POA: Diagnosis not present

## 2021-01-24 NOTE — Progress Notes (Signed)
Cardiology Office Note:   Date:  01/25/2021  NAME:  Kevin Le    MRN: [id number] DOB:  1940/11/17   PCP:  Deland Pretty, MD  Cardiologist:  No primary care provider on file.  Electrophysiologist:  None   Referring MD: Deland Pretty, MD   Chief Complaint  Patient presents with  . Coronary Artery Disease    History of Present Illness:   Kevin Le is a 80 y.o. male with a hx of CAD/coronary calcium who is being seen today for the evaluation of CAD at the request of Deland Pretty, MD.  He recently underwent coronary calcium scoring.  Coronary calcium score noted below.  He reports no chest pain or shortness of breath.  Medical history significant for hyperlipidemia and hypertension.  His blood pressure is acceptable today 134/90.  He apparently has been working with his primary care physician on the appropriate medication.  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  He works 5 days/week 9 to 10-hour days.  He describes no chest pain or shortness of breath.  He will climb a flight of stairs without any limitations.  His EKG in office demonstrates normal sinus rhythm with no acute ischemic changes or evidence of infarction.  His cardiovascular examination is normal.  He is a former smoker of roughly 10 years in the past.  He quit 30 to 40 years ago.  He had hernia surgery but no cardiac issues.  His most recent LDL cholesterol was 118.  He has been placed on Crestor and will have new labs this week.  He overall appears to be in good health without any major symptoms today in the office.  Problem List 1. CAD -coronary calcium score 745 (67th percentile) 2. HLD -Total cholesterol 168, HDL 40, LDL 118, triglycerides 116 3. HTN  Past Medical History: Past Medical History:  Diagnosis Date  . Coronary artery disease   . GERD (gastroesophageal reflux disease)   . Hyperlipidemia   . Hypertension   . Osteopenia     Past Surgical History: Past Surgical History:  Procedure  Laterality Date  . HERNIA REPAIR      Current Medications: Current Meds  Medication Sig  . aspirin 81 MG tablet Take 81 mg by mouth daily.  . calcium-vitamin D (OSCAL WITH D) 500-200 MG-UNIT tablet Take 1 tablet by mouth. With D3 400 units  . chlorthalidone (HYGROTON) 25 MG tablet Take 25 mg by mouth daily.  . clobetasol (TEMOVATE) 0.05 % external solution   . desoximetasone (TOPICORT) 0.25 % cream Apply topically 2 (two) times daily.  . Multiple Vitamin (MULTIVITAMIN) tablet Take 1 tablet by mouth daily.  . rosuvastatin (CRESTOR) 20 MG tablet Take 20 mg by mouth daily.     Allergies:    Demerol [meperidine]   Social History: Social History   Socioeconomic History  . Marital status: Married    Spouse name: Not on file  . Number of children: 1  . Years of education: Not on file  . Highest education level: Not on file  Occupational History  . Occupation: [employer] - [employer]  Tobacco Use  . Smoking status: Former Smoker    Packs/day: 0.50    Years: 10.00    Pack years: 5.00  . Smokeless tobacco: Never Used  Substance and Sexual Activity  . Alcohol use: No  . Drug use: No  . Sexual activity: Not on file  Other Topics Concern  . Not on file  Social History Narrative  .  Not on file   Social Determinants of Health   Financial Resource Strain: Not on file  Food Insecurity: Not on file  Transportation Needs: Not on file  Physical Activity: Not on file  Stress: Not on file  Social Connections: Not on file     Family History: The patient's family history includes Aortic aneurysm in his father.  ROS:   All other ROS reviewed and negative. Pertinent positives noted in the HPI.     EKGs/Labs/Other Studies Reviewed:   The following studies were personally reviewed by me today:  EKG:  EKG is ordered today.  The ekg ordered today demonstrates normal sinus rhythm heart rate 77, no acute ischemic changes or evidence of infarction, and was personally reviewed by me.    Recent Labs: No results found for requested labs within last 8760 hours.   Recent Lipid Panel No results found for: CHOL, TRIG, HDL, CHOLHDL, VLDL, LDLCALC, LDLDIRECT  Physical Exam:   VS:  BP 134/90   Pulse 77   Ht 5\' 9"  (1.753 m)   Wt 145 lb 3.2 oz (65.9 kg)   SpO2 95%   BMI 21.44 kg/m    Wt Readings from Last 3 Encounters:  01/25/21 145 lb 3.2 oz (65.9 kg)    General: Well nourished, well developed, in no acute distress Head: Atraumatic, normal size  Eyes: PEERLA, EOMI  Neck: Supple, no JVD Endocrine: No thryomegaly Cardiac: Normal S1, S2; RRR; no murmurs, rubs, or gallops Lungs: Clear to auscultation bilaterally, no wheezing, rhonchi or rales  Abd: Soft, nontender, no hepatomegaly  Ext: No edema, pulses 2+ Musculoskeletal: No deformities, BUE and BLE strength normal and equal Skin: Warm and dry, no rashes   Neuro: Alert and oriented to person, place, time, and situation, CNII-XII grossly intact, no focal deficits  Psych: Normal mood and affect   ASSESSMENT:   Kevin Le is a 80 y.o. male who presents for the following: 1. Coronary artery disease involving native coronary artery of native heart without angina pectoris   2. 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 Mixed hyperlipidemia   4. Primary hypertension     PLAN:   1. Coronary artery disease involving native coronary artery of native heart without angina pectoris 2. Agatston coronary artery calcium score greater than 400 3. Mixed hyperlipidemia -Coronary calcium score 745.  This is the 67th percentile.  EKG in office demonstrates normal sinus rhythm with no acute ischemic changes.  Cardiovascular examination is normal.  He has no symptoms concerning for underlying obstructive coronary artery disease.  He can maintain high level of activity without any limitations.  I doubt he has underlying obstructive CAD. -Mainstay of treatment is aspirin 81 mg daily.  I also agree with Crestor 20  mg daily.  He should aim for a goal LDL cholesterol less than 70.  He will have repeat labs by his primary care physician this week.  He will forward [place] the results.  Moving forward he will see [place] yearly.  He will notify us of any change in symptoms he may have. -He was also counseled on dietary recommendations including the Mediterranean diet.  He was also counseled on proper exercise regimen.  He should aim for 150 minutes/week of moderate to vigorous physical activity.  4. Primary hypertension -Would recommend blood pressure less than 130/80.  He will continue to work with his primary care physician on this.  Disposition: Return in about 1 year (around 01/25/2022).  Medication Adjustments/Labs  and Tests Ordered: Current medicines are reviewed at length with the patient today.  Concerns regarding medicines are outlined above.  Orders Placed This Encounter  Procedures  . EKG 12-Lead   No orders of the defined types were placed in this encounter.   Patient Instructions  Medication Instructions:  The current medical regimen is effective;  continue present plan and medications.  *If you need a refill on your cardiac medications before your next appointment, please call your pharmacy*   Follow-Up: At [facility], you and your health needs are our priority.  As part of our continuing mission to provide you with exceptional heart care, we have created designated Provider Care Teams.  These Care Teams include your primary Cardiologist (physician) and Advanced Practice Providers (APPs -  Physician Assistants and Nurse Practitioners) who all work together to provide you with the care you need, when you need it.  We recommend signing up for the patient portal called "MyChart".  Sign up information is provided on this After Visit Summary.  MyChart is used to connect with patients for Virtual Visits (Telemedicine).  Patients are able to view lab/test results, encounter notes, upcoming appointments,  etc.  Non-urgent messages can be sent to your provider as well.   To learn more about what you can do with MyChart, go to [email or web address].    Your next appointment:   12 month(s)  The format for your next appointment:   In Person  Provider:   You may see Eleonore Chiquito, MD or one of the following Advanced Practice Providers on your designated Care Team:    Almyra Deforest, PA-C  Fabian Sharp, PA-C or   Roby Lofts, PA-C       Signed, Addison Naegeli. Audie Box, MD, [facility]  [address] [place], [place] [address] [phone]  01/25/2021 10:23 AM

## 2021-01-25 ENCOUNTER — Other Ambulatory Visit: Payer: Self-pay

## 2021-01-25 ENCOUNTER — Ambulatory Visit (INDEPENDENT_AMBULATORY_CARE_PROVIDER_SITE_OTHER): Payer: Medicare Other | Admitting: Cardiovascular Disease

## 2021-01-25 ENCOUNTER — Encounter: Payer: Self-pay | Admitting: Cardiovascular Disease

## 2021-01-25 VITALS — BP 134/90 | HR 77 | Ht 69.0 in | Wt 145.2 lb

## 2021-01-25 DIAGNOSIS — I10 Essential (primary) hypertension: Secondary | ICD-10-CM

## 2021-01-25 DIAGNOSIS — I2584 Coronary atherosclerosis due to calcified coronary lesion: Secondary | ICD-10-CM | POA: Diagnosis not present

## 2021-01-25 DIAGNOSIS — R7989 Other specified abnormal findings of blood chemistry: Secondary | ICD-10-CM | POA: Diagnosis not present

## 2021-01-25 DIAGNOSIS — I2510 Atherosclerotic heart disease of native coronary artery without angina pectoris: Secondary | ICD-10-CM | POA: Diagnosis not present

## 2021-01-25 DIAGNOSIS — R931 Abnormal findings on diagnostic imaging of heart and coronary circulation: Secondary | ICD-10-CM | POA: Diagnosis not present

## 2021-01-25 DIAGNOSIS — E782 Mixed hyperlipidemia: Secondary | ICD-10-CM

## 2021-01-25 NOTE — Patient Instructions (Addendum)
Medication Instructions:  The current medical regimen is effective;  continue present plan and medications.   *If you need a refill on your cardiac medications before your next appointment, please call your pharmacy*   Follow-Up: At [facility], you and your health needs are our priority.  As part of our continuing mission to provide you with exceptional heart care, we have created designated Provider Care Teams.  These Care Teams include your primary Cardiologist (physician) and Advanced Practice Providers (APPs -  Physician Assistants and Nurse Practitioners) who all work together to provide you with the care you need, when you need it.  We recommend signing up for the patient portal called "MyChart".  Sign up information is provided on this After Visit Summary.  MyChart is used to connect with patients for Virtual Visits (Telemedicine).  Patients are able to view lab/test results, encounter notes, upcoming appointments, etc.  Non-urgent messages can be sent to your provider as well.   To learn more about what you can do with MyChart, go to [email or web address].    Your next appointment:   12 month(s)  The format for your next appointment:   In Person  Provider:   You may see Trinidad O'Neal, MD or one of the following Advanced Practice Providers on your designated Care Team:   Hao Meng, PA-C Angela Duke, PA-C or  Krista Kroeger, PA-C     

## 2021-02-22 DIAGNOSIS — Z23 Encounter for immunization: Secondary | ICD-10-CM | POA: Diagnosis not present

## 2021-03-22 DIAGNOSIS — E7800 Pure hypercholesterolemia, unspecified: Secondary | ICD-10-CM | POA: Diagnosis not present

## 2021-03-22 DIAGNOSIS — K219 Gastro-esophageal reflux disease without esophagitis: Secondary | ICD-10-CM | POA: Diagnosis not present

## 2021-03-22 DIAGNOSIS — I2584 Coronary atherosclerosis due to calcified coronary lesion: Secondary | ICD-10-CM | POA: Diagnosis not present

## 2021-03-22 DIAGNOSIS — I10 Essential (primary) hypertension: Secondary | ICD-10-CM | POA: Diagnosis not present

## 2021-05-03 DIAGNOSIS — E041 Nontoxic single thyroid nodule: Secondary | ICD-10-CM | POA: Diagnosis not present

## 2021-05-03 DIAGNOSIS — Z8619 Personal history of other infectious and parasitic diseases: Secondary | ICD-10-CM | POA: Diagnosis not present

## 2021-05-03 DIAGNOSIS — M8580 Other specified disorders of bone density and structure, unspecified site: Secondary | ICD-10-CM | POA: Diagnosis not present

## 2021-05-03 DIAGNOSIS — Z6821 Body mass index (BMI) 21.0-21.9, adult: Secondary | ICD-10-CM | POA: Diagnosis not present

## 2021-05-03 DIAGNOSIS — I10 Essential (primary) hypertension: Secondary | ICD-10-CM | POA: Diagnosis not present

## 2021-05-03 DIAGNOSIS — I2510 Atherosclerotic heart disease of native coronary artery without angina pectoris: Secondary | ICD-10-CM | POA: Diagnosis not present

## 2021-05-03 DIAGNOSIS — I2584 Coronary atherosclerosis due to calcified coronary lesion: Secondary | ICD-10-CM | POA: Diagnosis not present

## 2021-06-21 DIAGNOSIS — E559 Vitamin D deficiency, unspecified: Secondary | ICD-10-CM | POA: Diagnosis not present

## 2021-06-21 DIAGNOSIS — E041 Nontoxic single thyroid nodule: Secondary | ICD-10-CM | POA: Diagnosis not present

## 2021-06-21 DIAGNOSIS — Z23 Encounter for immunization: Secondary | ICD-10-CM | POA: Diagnosis not present

## 2021-06-28 DIAGNOSIS — M8580 Other specified disorders of bone density and structure, unspecified site: Secondary | ICD-10-CM | POA: Diagnosis not present

## 2021-06-28 DIAGNOSIS — E559 Vitamin D deficiency, unspecified: Secondary | ICD-10-CM | POA: Diagnosis not present

## 2021-06-28 DIAGNOSIS — I2510 Atherosclerotic heart disease of native coronary artery without angina pectoris: Secondary | ICD-10-CM | POA: Diagnosis not present

## 2021-06-28 DIAGNOSIS — Z6821 Body mass index (BMI) 21.0-21.9, adult: Secondary | ICD-10-CM | POA: Diagnosis not present

## 2021-06-28 DIAGNOSIS — Z8619 Personal history of other infectious and parasitic diseases: Secondary | ICD-10-CM | POA: Diagnosis not present

## 2021-06-28 DIAGNOSIS — I10 Essential (primary) hypertension: Secondary | ICD-10-CM | POA: Diagnosis not present

## 2021-06-28 DIAGNOSIS — E041 Nontoxic single thyroid nodule: Secondary | ICD-10-CM | POA: Diagnosis not present

## 2021-06-28 DIAGNOSIS — I2584 Coronary atherosclerosis due to calcified coronary lesion: Secondary | ICD-10-CM | POA: Diagnosis not present

## 2021-07-12 DIAGNOSIS — L400 Psoriasis vulgaris: Secondary | ICD-10-CM | POA: Diagnosis not present

## 2021-07-12 DIAGNOSIS — L84 Corns and callosities: Secondary | ICD-10-CM | POA: Diagnosis not present

## 2021-07-12 DIAGNOSIS — L821 Other seborrheic keratosis: Secondary | ICD-10-CM | POA: Diagnosis not present

## 2021-07-12 DIAGNOSIS — H5203 Hypermetropia, bilateral: Secondary | ICD-10-CM | POA: Diagnosis not present

## 2021-07-12 DIAGNOSIS — L853 Xerosis cutis: Secondary | ICD-10-CM | POA: Diagnosis not present

## 2021-07-12 DIAGNOSIS — H2513 Age-related nuclear cataract, bilateral: Secondary | ICD-10-CM | POA: Diagnosis not present

## 2021-07-12 DIAGNOSIS — L812 Freckles: Secondary | ICD-10-CM | POA: Diagnosis not present

## 2021-07-12 DIAGNOSIS — H31003 Unspecified chorioretinal scars, bilateral: Secondary | ICD-10-CM | POA: Diagnosis not present

## 2021-08-02 DIAGNOSIS — Z23 Encounter for immunization: Secondary | ICD-10-CM | POA: Diagnosis not present

## 2021-08-02 DIAGNOSIS — Z20822 Contact with and (suspected) exposure to covid-19: Secondary | ICD-10-CM | POA: Diagnosis not present

## 2021-10-28 DIAGNOSIS — Z125 Encounter for screening for malignant neoplasm of prostate: Secondary | ICD-10-CM | POA: Diagnosis not present

## 2021-10-28 DIAGNOSIS — I10 Essential (primary) hypertension: Secondary | ICD-10-CM | POA: Diagnosis not present

## 2021-11-01 DIAGNOSIS — Z0000 Encounter for general adult medical examination without abnormal findings: Secondary | ICD-10-CM | POA: Diagnosis not present

## 2021-11-01 DIAGNOSIS — Z1211 Encounter for screening for malignant neoplasm of colon: Secondary | ICD-10-CM | POA: Diagnosis not present

## 2021-11-01 DIAGNOSIS — M8589 Other specified disorders of bone density and structure, multiple sites: Secondary | ICD-10-CM | POA: Diagnosis not present

## 2021-11-01 DIAGNOSIS — Z1212 Encounter for screening for malignant neoplasm of rectum: Secondary | ICD-10-CM | POA: Diagnosis not present

## 2021-11-01 DIAGNOSIS — N1831 Chronic kidney disease, stage 3a: Secondary | ICD-10-CM | POA: Diagnosis not present

## 2021-11-01 DIAGNOSIS — I2510 Atherosclerotic heart disease of native coronary artery without angina pectoris: Secondary | ICD-10-CM | POA: Diagnosis not present

## 2021-11-01 DIAGNOSIS — I10 Essential (primary) hypertension: Secondary | ICD-10-CM | POA: Diagnosis not present

## 2021-11-01 DIAGNOSIS — K219 Gastro-esophageal reflux disease without esophagitis: Secondary | ICD-10-CM | POA: Diagnosis not present

## 2021-11-01 DIAGNOSIS — M8580 Other specified disorders of bone density and structure, unspecified site: Secondary | ICD-10-CM | POA: Diagnosis not present

## 2021-11-01 DIAGNOSIS — N401 Enlarged prostate with lower urinary tract symptoms: Secondary | ICD-10-CM | POA: Diagnosis not present

## 2022-02-14 DIAGNOSIS — Z8619 Personal history of other infectious and parasitic diseases: Secondary | ICD-10-CM | POA: Diagnosis not present

## 2022-02-14 DIAGNOSIS — I2584 Coronary atherosclerosis due to calcified coronary lesion: Secondary | ICD-10-CM | POA: Diagnosis not present

## 2022-02-14 DIAGNOSIS — I10 Essential (primary) hypertension: Secondary | ICD-10-CM | POA: Diagnosis not present

## 2022-02-14 DIAGNOSIS — M8580 Other specified disorders of bone density and structure, unspecified site: Secondary | ICD-10-CM | POA: Diagnosis not present

## 2022-02-14 DIAGNOSIS — Z6821 Body mass index (BMI) 21.0-21.9, adult: Secondary | ICD-10-CM | POA: Diagnosis not present

## 2022-02-14 DIAGNOSIS — I2510 Atherosclerotic heart disease of native coronary artery without angina pectoris: Secondary | ICD-10-CM | POA: Diagnosis not present

## 2022-02-14 DIAGNOSIS — E041 Nontoxic single thyroid nodule: Secondary | ICD-10-CM | POA: Diagnosis not present

## 2022-03-27 NOTE — Progress Notes (Signed)
Cardiology Office Note:   Date:  03/28/2022  NAME:  Kevin Le    MRN: [id number] DOB:  1941/04/12   PCP:  Merri Brunette, MD  Cardiologist:  None  Electrophysiologist:  None   Referring MD: Merri Brunette, MD   Chief Complaint  Patient presents with   Chest Pain    History of Present Illness:   [name] is a 81 y.o. male with a hx of CAD, HTN, HLD who presents for follow-up.  He reports he is doing well.  Denies any chest pain or trouble breathing.  Walking 30 minutes 3 days/week.  On Sundays he can walk up to 1 hour.  Still working as a Psychologist, prison and probation services.  Most recent cholesterol level at goal.  EKG is normal.  Denies any symptoms.  Blood pressure is well controlled.  Problem List 1. CAD -coronary calcium score 745 (67th percentile) 2. HLD -Total cholesterol 143, HDL 37, LDL 61, triglycerides 132 3. HTN  Past Medical History: Past Medical History:  Diagnosis Date   Coronary artery disease    GERD (gastroesophageal reflux disease)    Hyperlipidemia    Hypertension    Osteopenia     Past Surgical History: Past Surgical History:  Procedure Laterality Date   HERNIA REPAIR      Current Medications: Current Meds  Medication Sig   aspirin 81 MG tablet Take 81 mg by mouth daily.   calcium-vitamin D (OSCAL WITH D) 500-200 MG-UNIT tablet Take 1 tablet by mouth. With D3 400 units   clobetasol (TEMOVATE) 0.05 % external solution    desoximetasone (TOPICORT) 0.25 % cream Apply topically 2 (two) times daily.   losartan (COZAAR) 25 MG tablet Take 25 mg by mouth daily.   magnesium oxide (MAG-OX) 400 (240 Mg) MG tablet Take 400 mg by mouth daily.   Multiple Vitamin (MULTIVITAMIN) tablet Take 1 tablet by mouth daily.   Omeprazole 20 MG TBDD Take 0.25 tablets by mouth daily.   rosuvastatin (CRESTOR) 20 MG tablet Take 20 mg by mouth daily.     Allergies:    Demerol [meperidine]   Social History: Social History   Socioeconomic History   Marital status: Married     Spouse name: Not on file   Number of children: 1   Years of education: Not on file   Highest education level: Not on file  Occupational History   Occupation: Paediatric nurse - Hair[employer]  Tobacco Use   Smoking status: Former    Packs/day: 0.50    Years: 10.00    Total pack years: 5.00    Types: Cigarettes   Smokeless tobacco: Never  Substance and Sexual Activity   Alcohol use: No   Drug use: No   Sexual activity: Not on file  Other Topics Concern   Not on file  Social History Narrative   Not on file   Social Determinants of Health   Financial Resource Strain: Not on file  Food Insecurity: Not on file  Transportation Needs: Not on file  Physical Activity: Not on file  Stress: Not on file  Social Connections: Not on file     Family History: The patient'sfamily history includes Aortic aneurysm in his father.  ROS:   All other ROS reviewed and negative. Pertinent positives noted in the HPI.     EKGs/Labs/Other Studies Reviewed:   The following studies were personally reviewed by me today:  EKG:  EKG is  ordered today.  The ekg ordered today demonstrates normal sinus rhythm heart  rate 63, no acute ischemic changes or evidence of infarction, and was personally reviewed by me.   Recent Labs: No results found for requested labs within last 365 days.   Recent Lipid Panel No results found for: "CHOL", "TRIG", "HDL", "CHOLHDL", "VLDL", "LDLCALC", "LDLDIRECT"  Physical Exam:   VS:  BP 124/72 (BP Location: Left Arm, Patient Position: Sitting, Cuff Size: Normal)   Pulse 63    Wt Readings from Last 3 Encounters:  01/25/21 145 lb 3.2 oz (65.9 kg)    General: Well nourished, well developed, in no acute distress Head: Atraumatic, normal size  Eyes: PEERLA, EOMI  Neck: Supple, no JVD Endocrine: No thryomegaly Cardiac: Normal S1, S2; RRR; no murmurs, rubs, or gallops Lungs: Clear to auscultation bilaterally, no wheezing, rhonchi or rales  Abd: Soft, nontender, no hepatomegaly   Ext: No edema, pulses 2+ Musculoskeletal: No deformities, BUE and BLE strength normal and equal Skin: Warm and dry, no rashes   Neuro: Alert and oriented to person, place, time, and situation, CNII-XII grossly intact, no focal deficits  Psych: Normal mood and affect   ASSESSMENT:   Kevin Le is a 81 y.o. male who presents for the following: 1. Agatston coronary artery calcium score greater than 400   2. Mixed hyperlipidemia     PLAN:   1. Agatston coronary artery calcium score greater than 400 2. Mixed hyperlipidemia -Elevated calcium score.  No symptoms.  On Crestor and aspirin.  LDL cholesterol at goal.  EKG is normal.  Exercising 3 days/week.  Can walk 30 minutes without chest pain or trouble breathing.  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  He will see me yearly.  Disposition: Return in about 1 year (around 03/29/2023).  Medication Adjustments/Labs and Tests Ordered: Current medicines are reviewed at length with the patient today.  Concerns regarding medicines are outlined above.  Orders Placed This Encounter  Procedures   EKG 12-Lead   No orders of the defined types were placed in this encounter.   Patient Instructions   Follow-Up: At [facility], you and your health needs are our priority.  As part of our continuing mission to provide you with exceptional heart care, we have created designated Provider Care Teams.  These Care Teams include your primary Cardiologist (physician) and Advanced Practice Providers (APPs -  Physician Assistants and Nurse Practitioners) who all work together to provide you with the care you need, when you need it.  We recommend signing up for the patient portal called "MyChart".  Sign up information is provided on this After Visit Summary.  MyChart is used to connect with patients for Virtual Visits (Telemedicine).  Patients are able to view lab/test results, encounter notes, upcoming appointments, etc.  Non-urgent messages  can be sent to your provider as well.   To learn more about what you can do with MyChart, go to [email or web address].    Your next appointment:   12 month(s)  The format for your next appointment:   In Person  Provider:   Lennie Odor MD      Important Information About Sugar         Time Spent with Patient: I have spent a total of 25 minutes with patient reviewing hospital notes, telemetry, EKGs, labs and examining the patient as well as establishing an assessment and plan that was discussed with the patient.  > 50% of time was spent in direct patient care.  Signed, Lenna Gilford. Flora Lipps, MD, [facility]  [facility]  [facility]  [address], [address] [place], [place] [address] [phone]  03/28/2022 1:55 PM

## 2022-03-28 ENCOUNTER — Ambulatory Visit (INDEPENDENT_AMBULATORY_CARE_PROVIDER_SITE_OTHER): Payer: Medicare Other | Admitting: Cardiovascular Disease

## 2022-03-28 ENCOUNTER — Encounter: Payer: Self-pay | Admitting: Cardiovascular Disease

## 2022-03-28 VITALS — BP 124/72 | HR 63

## 2022-03-28 DIAGNOSIS — E782 Mixed hyperlipidemia: Secondary | ICD-10-CM

## 2022-03-28 DIAGNOSIS — R931 Abnormal findings on diagnostic imaging of heart and coronary circulation: Secondary | ICD-10-CM

## 2022-06-27 DIAGNOSIS — Z23 Encounter for immunization: Secondary | ICD-10-CM | POA: Diagnosis not present

## 2022-07-11 DIAGNOSIS — L821 Other seborrheic keratosis: Secondary | ICD-10-CM | POA: Diagnosis not present

## 2022-07-11 DIAGNOSIS — D2261 Melanocytic nevi of right upper limb, including shoulder: Secondary | ICD-10-CM | POA: Diagnosis not present

## 2022-07-11 DIAGNOSIS — D2272 Melanocytic nevi of left lower limb, including hip: Secondary | ICD-10-CM | POA: Diagnosis not present

## 2022-07-11 DIAGNOSIS — D2271 Melanocytic nevi of right lower limb, including hip: Secondary | ICD-10-CM | POA: Diagnosis not present

## 2022-07-11 DIAGNOSIS — L814 Other melanin hyperpigmentation: Secondary | ICD-10-CM | POA: Diagnosis not present

## 2022-07-11 DIAGNOSIS — D1801 Hemangioma of skin and subcutaneous tissue: Secondary | ICD-10-CM | POA: Diagnosis not present

## 2022-07-11 DIAGNOSIS — D225 Melanocytic nevi of trunk: Secondary | ICD-10-CM | POA: Diagnosis not present

## 2022-07-11 DIAGNOSIS — D2262 Melanocytic nevi of left upper limb, including shoulder: Secondary | ICD-10-CM | POA: Diagnosis not present

## 2022-07-11 DIAGNOSIS — L400 Psoriasis vulgaris: Secondary | ICD-10-CM | POA: Diagnosis not present

## 2022-07-18 DIAGNOSIS — H31003 Unspecified chorioretinal scars, bilateral: Secondary | ICD-10-CM | POA: Diagnosis not present

## 2022-07-18 DIAGNOSIS — H5213 Myopia, bilateral: Secondary | ICD-10-CM | POA: Diagnosis not present

## 2022-07-18 DIAGNOSIS — H52203 Unspecified astigmatism, bilateral: Secondary | ICD-10-CM | POA: Diagnosis not present

## 2022-07-18 DIAGNOSIS — H524 Presbyopia: Secondary | ICD-10-CM | POA: Diagnosis not present

## 2022-07-18 DIAGNOSIS — H2513 Age-related nuclear cataract, bilateral: Secondary | ICD-10-CM | POA: Diagnosis not present

## 2022-08-29 DIAGNOSIS — K5730 Diverticulosis of large intestine without perforation or abscess without bleeding: Secondary | ICD-10-CM | POA: Diagnosis not present

## 2022-08-29 DIAGNOSIS — D123 Benign neoplasm of transverse colon: Secondary | ICD-10-CM | POA: Diagnosis not present

## 2022-08-29 DIAGNOSIS — K635 Polyp of colon: Secondary | ICD-10-CM | POA: Diagnosis not present

## 2022-08-29 DIAGNOSIS — Z1211 Encounter for screening for malignant neoplasm of colon: Secondary | ICD-10-CM | POA: Diagnosis not present

## 2022-08-29 DIAGNOSIS — K648 Other hemorrhoids: Secondary | ICD-10-CM | POA: Diagnosis not present

## 2022-08-29 DIAGNOSIS — D125 Benign neoplasm of sigmoid colon: Secondary | ICD-10-CM | POA: Diagnosis not present

## 2022-08-29 DIAGNOSIS — D122 Benign neoplasm of ascending colon: Secondary | ICD-10-CM | POA: Diagnosis not present

## 2022-09-09 DIAGNOSIS — D125 Benign neoplasm of sigmoid colon: Secondary | ICD-10-CM | POA: Diagnosis not present

## 2022-09-09 DIAGNOSIS — K5730 Diverticulosis of large intestine without perforation or abscess without bleeding: Secondary | ICD-10-CM | POA: Diagnosis not present

## 2022-09-09 DIAGNOSIS — K648 Other hemorrhoids: Secondary | ICD-10-CM | POA: Diagnosis not present

## 2022-09-09 DIAGNOSIS — Z1211 Encounter for screening for malignant neoplasm of colon: Secondary | ICD-10-CM | POA: Diagnosis not present

## 2022-09-09 DIAGNOSIS — D122 Benign neoplasm of ascending colon: Secondary | ICD-10-CM | POA: Diagnosis not present

## 2022-09-09 DIAGNOSIS — D123 Benign neoplasm of transverse colon: Secondary | ICD-10-CM | POA: Diagnosis not present

## 2022-11-21 DIAGNOSIS — I10 Essential (primary) hypertension: Secondary | ICD-10-CM | POA: Diagnosis not present

## 2022-11-21 DIAGNOSIS — E041 Nontoxic single thyroid nodule: Secondary | ICD-10-CM | POA: Diagnosis not present

## 2022-11-28 DIAGNOSIS — I10 Essential (primary) hypertension: Secondary | ICD-10-CM | POA: Diagnosis not present

## 2022-11-28 DIAGNOSIS — E785 Hyperlipidemia, unspecified: Secondary | ICD-10-CM | POA: Diagnosis not present

## 2022-11-28 DIAGNOSIS — E041 Nontoxic single thyroid nodule: Secondary | ICD-10-CM | POA: Diagnosis not present

## 2022-11-28 DIAGNOSIS — Z0000 Encounter for general adult medical examination without abnormal findings: Secondary | ICD-10-CM | POA: Diagnosis not present

## 2022-11-28 DIAGNOSIS — K219 Gastro-esophageal reflux disease without esophagitis: Secondary | ICD-10-CM | POA: Diagnosis not present

## 2022-11-28 DIAGNOSIS — I2510 Atherosclerotic heart disease of native coronary artery without angina pectoris: Secondary | ICD-10-CM | POA: Diagnosis not present

## 2022-11-28 DIAGNOSIS — M8580 Other specified disorders of bone density and structure, unspecified site: Secondary | ICD-10-CM | POA: Diagnosis not present

## 2022-11-28 DIAGNOSIS — N1831 Chronic kidney disease, stage 3a: Secondary | ICD-10-CM | POA: Diagnosis not present

## 2022-11-28 DIAGNOSIS — N401 Enlarged prostate with lower urinary tract symptoms: Secondary | ICD-10-CM | POA: Diagnosis not present

## 2022-11-28 DIAGNOSIS — Z23 Encounter for immunization: Secondary | ICD-10-CM | POA: Diagnosis not present

## 2023-01-05 IMAGING — CT CT CARDIAC CORONARY ARTERY CALCIUM SCORE
3 series · 14 of 20 positions shown, 16 images · non-contrast
Comparison: Chest CT 03/03/2005

CLINICAL DATA: 80-year-old white male with hyperlipidemia.

EXAM:
CT CARDIAC CORONARY ARTERY CALCIUM SCORE
TECHNIQUE: Non-contrast imaging through the heart was performed using
prospective ECG gating. Image post processing was performed on an
independent workstation, allowing for quantitative analysis of the
heart and coronary arteries. Note that this exam targets the heart
and the chest was not imaged in its entirety.

[Series 2: calcium scoring 2.00 qr36 bestdiast 71% hrt calciu · axial · 0.37mm/px · z∈[+1597,+1681]mm · 4 of 70 slices shown]
[im 14/70  vessel]
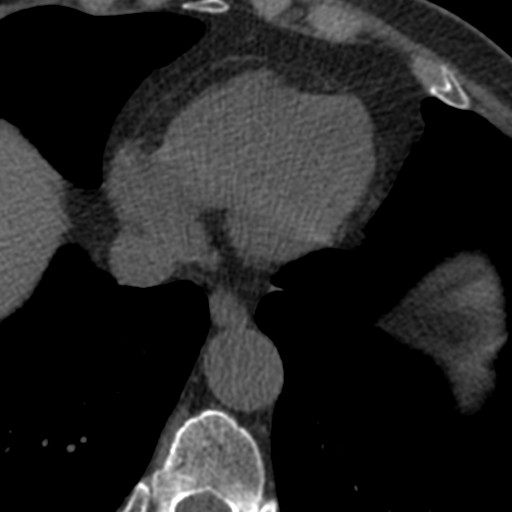
[im 28/70  vessel]
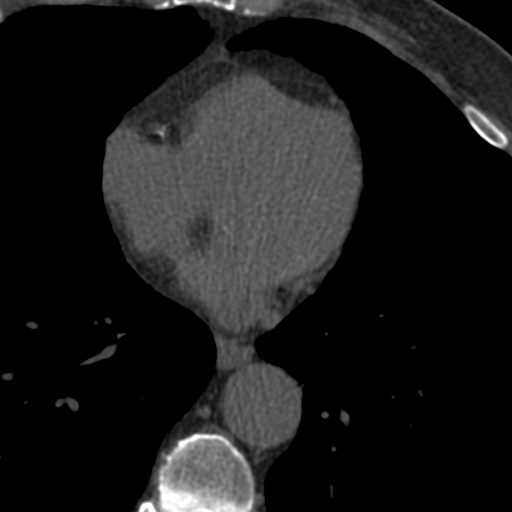
[im 42/70  vessel]
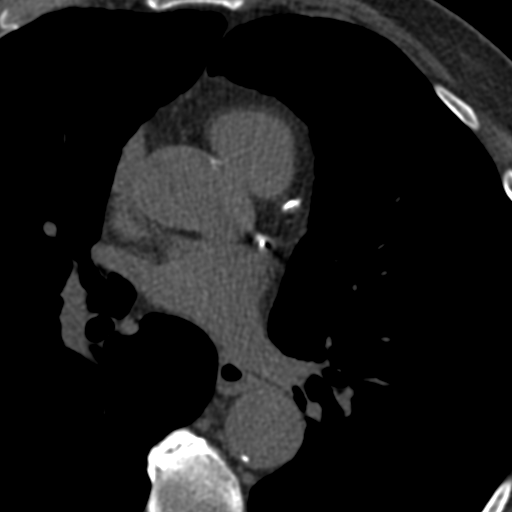
[im 56/70  vessel]
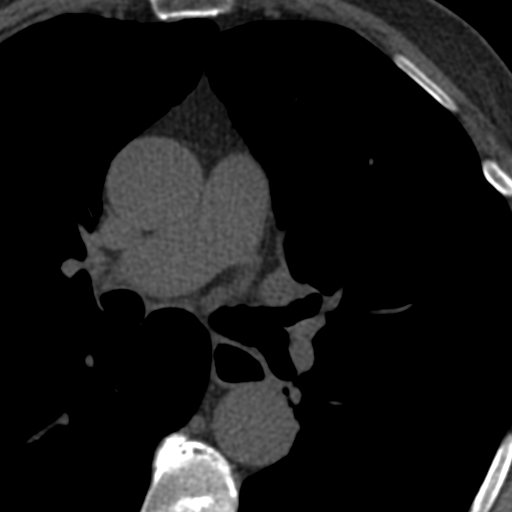

[Series 3: calcium scoring 2.00 br40 bestdiast 71% axial · axial · 0.62mm/px · z∈[+1593,+1685]mm · 5 of 70 slices shown, 7 images]
[im 12/70  vessel]
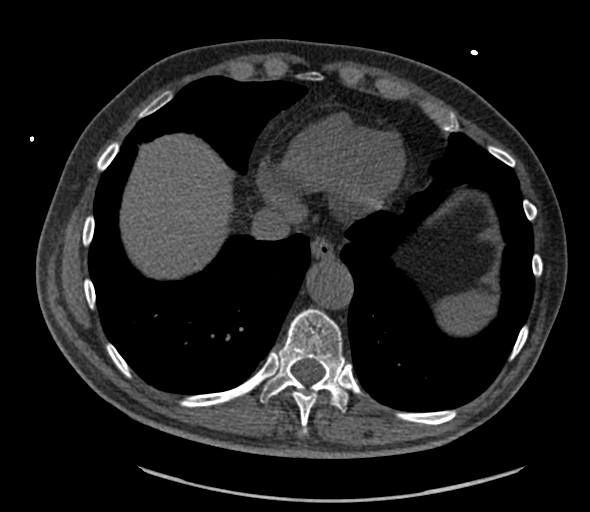
[im 12/70  lung]
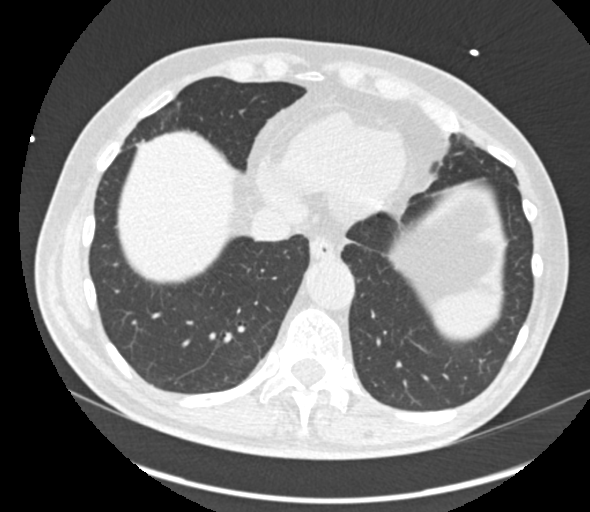
[im 24/70  vessel]
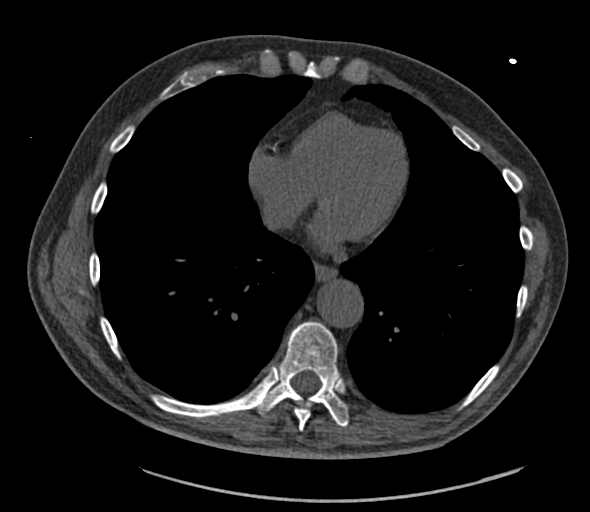
[im 35/70  vessel]
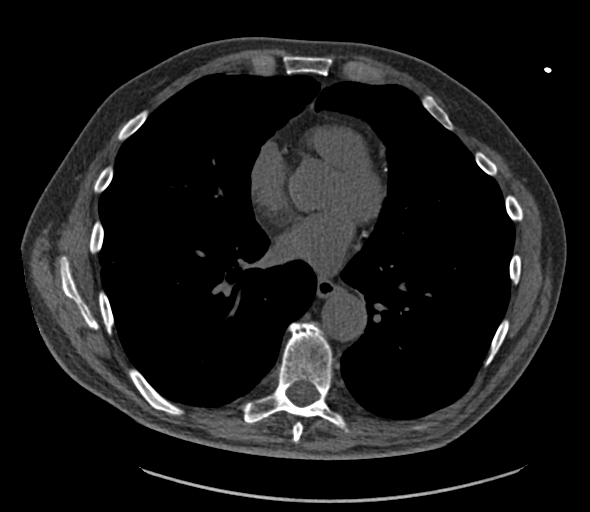
[im 47/70  vessel]
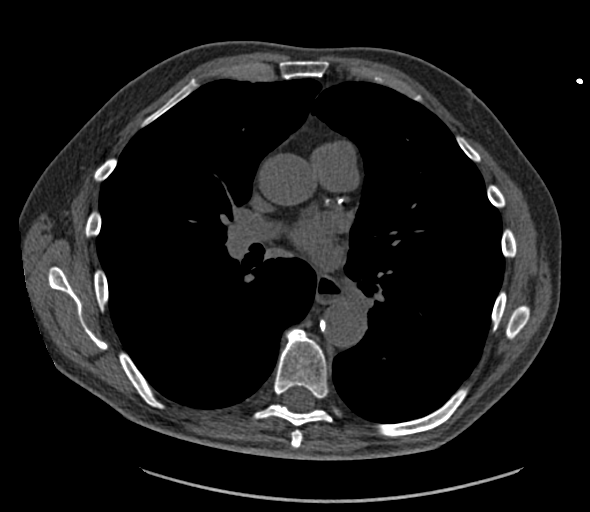
[im 58/70  vessel]
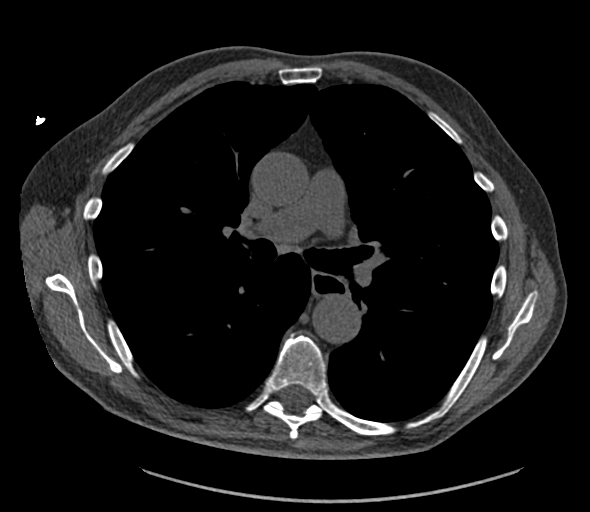
[im 58/70  lung]
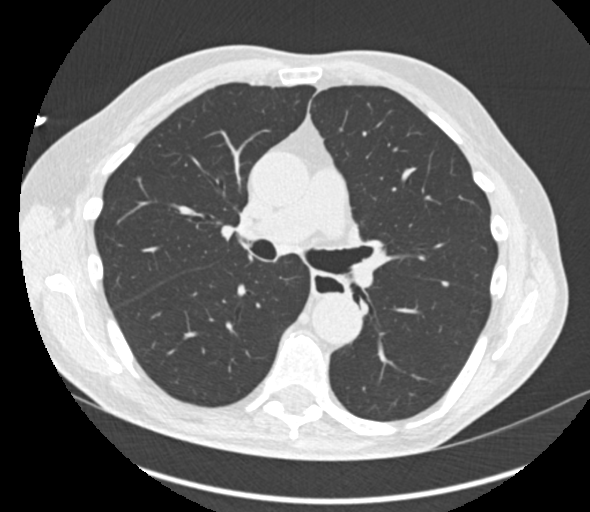

[Series 9: calcium scoring 2.00 br60 bestdiast 71% lungs · axial · 0.62mm/px · z∈[+1593,+1685]mm · 5 of 70 slices shown]
[im 12/70  vessel]
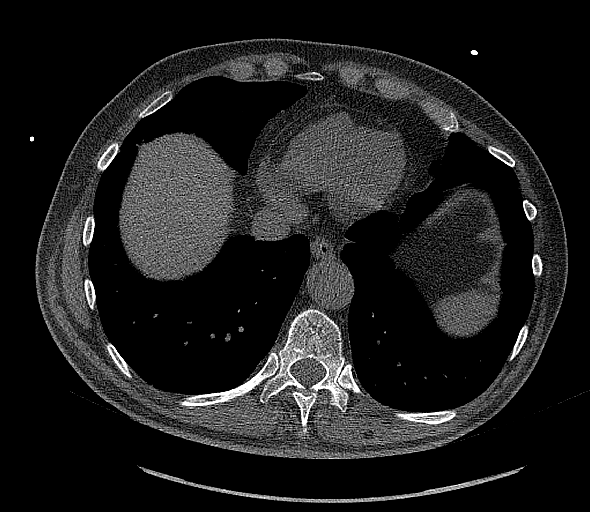
[im 24/70  vessel]
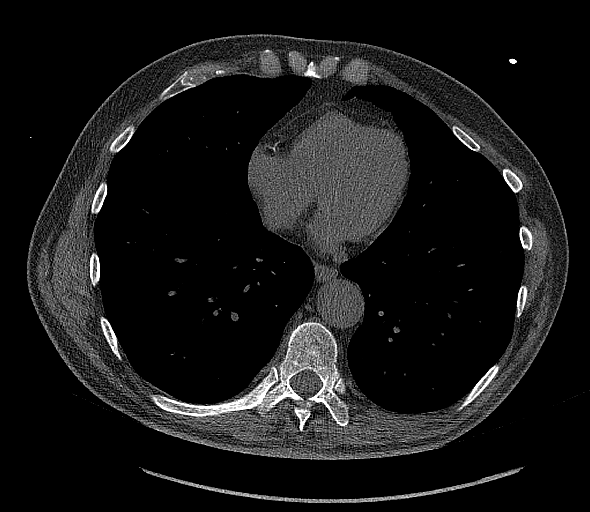
[im 35/70  vessel]
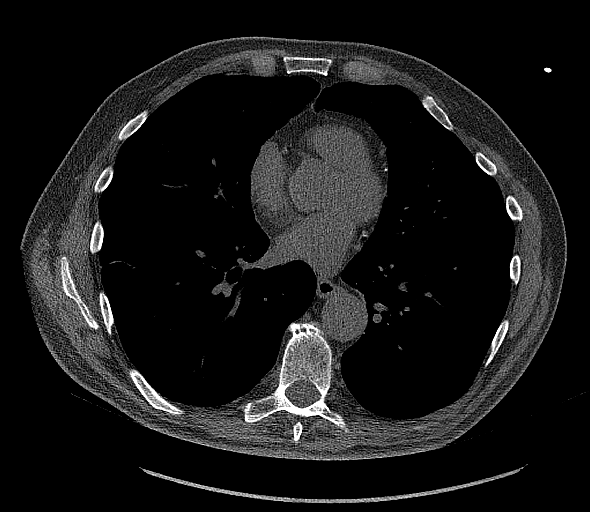
[im 47/70  vessel]
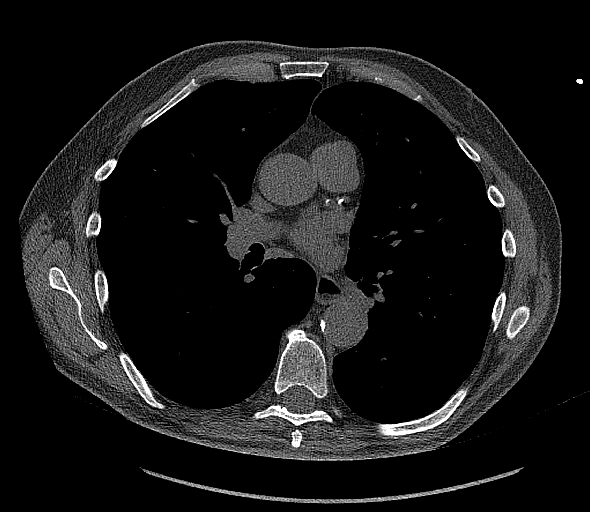
[im 58/70  vessel]
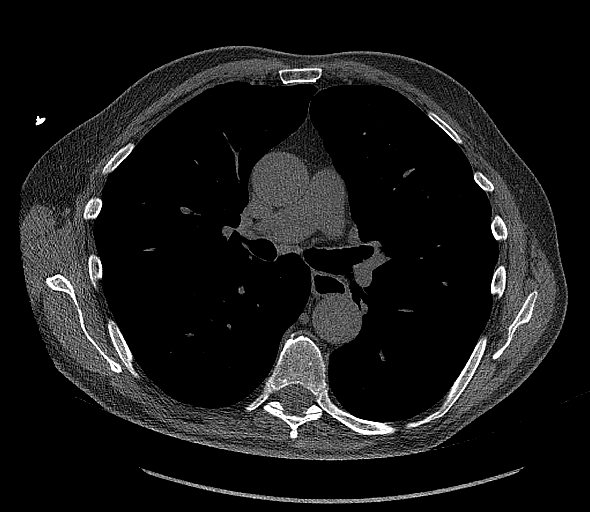

[14 of 20 positions shown; findings below may reference images not displayed]

FINDINGS: CORONARY CALCIUM SCORES:

Left Main: 24

LAD: 427

LCx: 244

RCA: 50

Total Agatston Score: 745

[HOSPITAL] percentile: 67

AORTA MEASUREMENTS:

Ascending Aorta: 35 mm

Descending Aorta: 30 mm

OTHER FINDINGS:

Small amount of calcium at the aortic root. Small amount of calcium
in the descending thoracic aorta. Heart size is normal. No
significant pericardial fluid. Mediastinal structures are
unremarkable. Visualized upper abdominal structures are
unremarkable. No large pleural effusions. Focal thickening or
nodularity along the left major fissure on sequence 9, image 13 was
present in 4008 and morphology is suggestive for peri-fissural lymph
node measuring roughly 3 mm. Bridging osteophytes in thoracic spine.
IMPRESSION: 1. Coronary calcium score is 745 and this is at percentile 67 for
patients of the same age, gender and ethnicity.
2.  Aortic Atherosclerosis ([id number]).

## 2023-05-07 NOTE — Progress Notes (Signed)
Cardiology Office Note:   Date:  05/08/2023  NAME:  Kevin Le    MRN: [id number] DOB:  1941-06-11   PCP:  Merri Brunette, MD  Cardiologist:  None  Electrophysiologist:  None   Referring MD: Merri Brunette, MD   Chief Complaint  Patient presents with   Follow-up         History of Present Illness:   Kevin Le is a 82 y.o. male with a hx of CAD, HTN, HLD who presents for follow-up.  Working as a Paediatric nurse 4 days/week.  Walking up to 2.5 miles twice per week.  No chest pains or trouble breathing.  Blood pressure is well-controlled.  I do not have the results of his lipids from this year but last year they are well within limits.  He reports they are well-controlled and there was no major change.  Number seems to be where they should be.  Denies any symptoms of chest pains or trouble breathing.  Doing quite well.  No updates to medical history from last year.  Problem List 1. CAD -coronary calcium score 745 (67th percentile) 2. HLD -Total cholesterol 143, HDL 37, LDL 61, triglycerides 132 3. HTN  Past Medical History: Past Medical History:  Diagnosis Date   Coronary artery disease    GERD (gastroesophageal reflux disease)    Hyperlipidemia    Hypertension    Osteopenia     Past Surgical History: Past Surgical History:  Procedure Laterality Date   HERNIA REPAIR      Current Medications: Current Meds  Medication Sig   aspirin 81 MG tablet Take 81 mg by mouth daily.   calcium-vitamin D (OSCAL WITH D) 500-200 MG-UNIT tablet Take 1 tablet by mouth. With D3 400 units   clobetasol (TEMOVATE) 0.05 % external solution    desoximetasone (TOPICORT) 0.25 % cream Apply topically 2 (two) times daily.   losartan (COZAAR) 25 MG tablet Take 25 mg by mouth daily.   magnesium oxide (MAG-OX) 400 (240 Mg) MG tablet Take 400 mg by mouth daily.   Multiple Vitamin (MULTIVITAMIN) tablet Take 1 tablet by mouth daily.   Omeprazole 20 MG TBDD Take 0.25 tablets by mouth daily.    rosuvastatin (CRESTOR) 20 MG tablet Take 20 mg by mouth daily.     Allergies:    Demerol [meperidine]   Social History: Social History   Socioeconomic History   Marital status: Married    Spouse name: Not on file   Number of children: 1   Years of education: Not on file   Highest education level: Not on file  Occupational History   Occupation: Paediatric nurse - [employer]  Tobacco Use   Smoking status: Former    Current packs/day: 0.50    Average packs/day: 0.5 packs/day for 10.0 years (5.0 ttl pk-yrs)    Types: Cigarettes   Smokeless tobacco: Never  Substance and Sexual Activity   Alcohol use: No   Drug use: No   Sexual activity: Not on file  Other Topics Concern   Not on file  Social History Narrative   Not on file   Social Determinants of Health   Financial Resource Strain: Not on file  Food Insecurity: Not on file  Transportation Needs: Not on file  Physical Activity: Not on file  Stress: Not on file  Social Connections: Not on file     Family History: The patient's family history includes Aortic aneurysm in his father.  ROS:   All other ROS reviewed and negative. Pertinent  positives noted in the HPI.     EKGs/Labs/Other Studies Reviewed:   The following studies were personally reviewed by me today:  EKG:  EKG is ordered today.    EKG Interpretation Date/Time:  Monday May 08 2023 13:01:32 EDT Ventricular Rate:  83 PR Interval:  162 QRS Duration:  86 QT Interval:  362 QTC Calculation: 425 R Axis:   69  Text Interpretation: Normal sinus rhythm Normal ECG Confirmed by Lennie Odor [phone]) on 05/08/2023 1:06:56 PM   Recent Labs: No results found for requested labs within last 365 days.   Recent Lipid Panel No results found for: "CHOL", "TRIG", "HDL", "CHOLHDL", "VLDL", "LDLCALC", "LDLDIRECT"  Physical Exam:   VS:  BP 108/64   Pulse 83   Ht 5\' 9"  (1.753 m)   Wt 148 lb 12.8 oz (67.5 kg)   SpO2 98%   BMI 21.97 kg/m    Wt Readings from Last 3  Encounters:  05/08/23 148 lb 12.8 oz (67.5 kg)  01/25/21 145 lb 3.2 oz (65.9 kg)    General: Well nourished, well developed, in no acute distress Head: Atraumatic, normal size  Eyes: PEERLA, EOMI  Neck: Supple, no JVD Endocrine: No thryomegaly Cardiac: Normal S1, S2; RRR; no murmurs, rubs, or gallops Lungs: Clear to auscultation bilaterally, no wheezing, rhonchi or rales  Abd: Soft, nontender, no hepatomegaly  Ext: No edema, pulses 2+ Musculoskeletal: No deformities, BUE and BLE strength normal and equal Skin: Warm and dry, no rashes   Neuro: Alert and oriented to person, place, time, and situation, CNII-XII grossly intact, no focal deficits  Psych: Normal mood and affect   ASSESSMENT:   Kevin Le is a 82 y.o. male who presents for the following: 1. Agatston coronary artery calcium score greater than 400   2. Mixed hyperlipidemia     PLAN:   1. Agatston coronary artery calcium score greater than 400 2. Mixed hyperlipidemia -Coronary calcium score 745 which is 67th percentile.  No symptoms of chest pains or trouble breathing.  He is easily hitting his exercise goals.  He is on aspirin 81 mg daily.  He is on Crestor 20 mg daily.  Lipids last year were within limits.  I do not have the results from this year.  We will reach out to Dr. Renne Crigler who we may review the records.  He will continue to see [place] yearly.     Disposition: Return in about 1 year (around 05/07/2024).  Medication Adjustments/Labs and Tests Ordered: Current medicines are reviewed at length with the patient today.  Concerns regarding medicines are outlined above.  Orders Placed This Encounter  Procedures   EKG 12-Lead   No orders of the defined types were placed in this encounter.  Patient Instructions  Medication Instructions:  No changes *If you need a refill on your cardiac medications before your next appointment, please call your pharmacy*   Lab Work: No labs If you have labs (blood work) drawn  today and your tests are completely normal, you will receive your results only by: MyChart Message (if you have MyChart) OR A paper copy in the mail If you have any lab test that is abnormal or we need to change your treatment, we will call you to review the results.   Testing/Procedures: No testing   Follow-Up: At [facility], you and your health needs are our priority.  As part of our continuing mission to provide you with exceptional heart care, we have created designated Provider Care Teams.  These Care Teams include your primary Cardiologist (physician) and Advanced Practice Providers (APPs -  Physician Assistants and Nurse Practitioners) who all work together to provide you with the care you need, when you need it.  We recommend signing up for the patient portal called "MyChart".  Sign up information is provided on this After Visit Summary.  MyChart is used to connect with patients for Virtual Visits (Telemedicine).  Patients are able to view lab/test results, encounter notes, upcoming appointments, etc.  Non-urgent messages can be sent to your provider as well.   To learn more about what you can do with MyChart, go to [email or web address].    Your next appointment:   1 year(s)  Provider:   Liberty Handy      Time Spent with Patient: I have spent a total of 25 minutes with patient reviewing hospital notes, telemetry, EKGs, labs and examining the patient as well as establishing an assessment and plan that was discussed with the patient.  > 50% of time was spent in direct patient care.  Signed, Lenna Gilford. Flora Lipps, MD, [facility]  [facility]  [address], [address] [place], [place] [address] [phone]  05/08/2023 1:18 PM

## 2023-05-08 ENCOUNTER — Ambulatory Visit: Payer: Medicare Other | Attending: Cardiovascular Disease | Admitting: Cardiovascular Disease

## 2023-05-08 ENCOUNTER — Encounter: Payer: Self-pay | Admitting: Cardiovascular Disease

## 2023-05-08 VITALS — BP 108/64 | HR 83 | Ht 69.0 in | Wt 148.8 lb

## 2023-05-08 DIAGNOSIS — R931 Abnormal findings on diagnostic imaging of heart and coronary circulation: Secondary | ICD-10-CM | POA: Diagnosis not present

## 2023-05-08 DIAGNOSIS — E782 Mixed hyperlipidemia: Secondary | ICD-10-CM | POA: Diagnosis not present

## 2023-05-08 NOTE — Patient Instructions (Signed)
Medication Instructions:  No changes *If you need a refill on your cardiac medications before your next appointment, please call your pharmacy*   Lab Work: No labs If you have labs (blood work) drawn today and your tests are completely normal, you will receive your results only by: MyChart Message (if you have MyChart) OR A paper copy in the mail If you have any lab test that is abnormal or we need to change your treatment, we will call you to review the results.   Testing/Procedures: No testing   Follow-Up: At [facility], you and your health needs are our priority.  As part of our continuing mission to provide you with exceptional heart care, we have created designated Provider Care Teams.  These Care Teams include your primary Cardiologist (physician) and Advanced Practice Providers (APPs -  Physician Assistants and Nurse Practitioners) who all work together to provide you with the care you need, when you need it.  We recommend signing up for the patient portal called "MyChart".  Sign up information is provided on this After Visit Summary.  MyChart is used to connect with patients for Virtual Visits (Telemedicine).  Patients are able to view lab/test results, encounter notes, upcoming appointments, etc.  Non-urgent messages can be sent to your provider as well.   To learn more about what you can do with MyChart, go to [email or web address].    Your next appointment:   1 year(s)  Provider:   Liberty Handy

## 2023-06-25 DIAGNOSIS — Z23 Encounter for immunization: Secondary | ICD-10-CM | POA: Diagnosis not present

## 2023-07-31 DIAGNOSIS — H43813 Vitreous degeneration, bilateral: Secondary | ICD-10-CM | POA: Diagnosis not present

## 2023-07-31 DIAGNOSIS — H52203 Unspecified astigmatism, bilateral: Secondary | ICD-10-CM | POA: Diagnosis not present

## 2023-07-31 DIAGNOSIS — H524 Presbyopia: Secondary | ICD-10-CM | POA: Diagnosis not present

## 2023-07-31 DIAGNOSIS — H25041 Posterior subcapsular polar age-related cataract, right eye: Secondary | ICD-10-CM | POA: Diagnosis not present

## 2023-07-31 DIAGNOSIS — H2513 Age-related nuclear cataract, bilateral: Secondary | ICD-10-CM | POA: Diagnosis not present

## 2023-08-14 DIAGNOSIS — L821 Other seborrheic keratosis: Secondary | ICD-10-CM | POA: Diagnosis not present

## 2023-08-14 DIAGNOSIS — L400 Psoriasis vulgaris: Secondary | ICD-10-CM | POA: Diagnosis not present

## 2023-08-14 DIAGNOSIS — D1801 Hemangioma of skin and subcutaneous tissue: Secondary | ICD-10-CM | POA: Diagnosis not present

## 2023-08-14 DIAGNOSIS — L570 Actinic keratosis: Secondary | ICD-10-CM | POA: Diagnosis not present

## 2024-03-25 DIAGNOSIS — E785 Hyperlipidemia, unspecified: Secondary | ICD-10-CM | POA: Diagnosis not present

## 2024-03-25 DIAGNOSIS — I10 Essential (primary) hypertension: Secondary | ICD-10-CM | POA: Diagnosis not present

## 2024-04-01 DIAGNOSIS — M8580 Other specified disorders of bone density and structure, unspecified site: Secondary | ICD-10-CM | POA: Diagnosis not present

## 2024-04-01 DIAGNOSIS — N401 Enlarged prostate with lower urinary tract symptoms: Secondary | ICD-10-CM | POA: Diagnosis not present

## 2024-04-01 DIAGNOSIS — N1831 Chronic kidney disease, stage 3a: Secondary | ICD-10-CM | POA: Diagnosis not present

## 2024-04-01 DIAGNOSIS — I10 Essential (primary) hypertension: Secondary | ICD-10-CM | POA: Diagnosis not present

## 2024-04-01 DIAGNOSIS — I2510 Atherosclerotic heart disease of native coronary artery without angina pectoris: Secondary | ICD-10-CM | POA: Diagnosis not present

## 2024-04-01 DIAGNOSIS — E785 Hyperlipidemia, unspecified: Secondary | ICD-10-CM | POA: Diagnosis not present

## 2024-04-01 DIAGNOSIS — E559 Vitamin D deficiency, unspecified: Secondary | ICD-10-CM | POA: Diagnosis not present

## 2024-04-01 DIAGNOSIS — K219 Gastro-esophageal reflux disease without esophagitis: Secondary | ICD-10-CM | POA: Diagnosis not present

## 2024-04-01 DIAGNOSIS — Z0000 Encounter for general adult medical examination without abnormal findings: Secondary | ICD-10-CM | POA: Diagnosis not present

## 2024-04-01 DIAGNOSIS — M8589 Other specified disorders of bone density and structure, multiple sites: Secondary | ICD-10-CM | POA: Diagnosis not present

## 2024-07-14 DIAGNOSIS — Z23 Encounter for immunization: Secondary | ICD-10-CM | POA: Diagnosis not present

## 2024-08-09 NOTE — Progress Notes (Signed)
 Cardiology Office Note:  .   Date:  08/12/2024  ID:  Kevin Le, DOB 1941-09-13, MRN [id number] PCP: Kevin Nottingham, MD  [facility] Providers Cardiologist:  None }   History of Present Illness: .    Chief Complaint  Patient presents with   Follow-up         Kevin Le is a 83 y.o. male with history of CAD who presents for follow-up.    History of Present Illness   Kevin Le is an 83 year old male with coronary artery disease, hypertension, and hyperlipidemia who presents for a follow-up visit.  He has a history of coronary artery disease with an elevated coronary calcium score of 745, placing him in the 67th percentile. He is currently asymptomatic with no chest pain or dyspnea.  He has hypertension and is taking losartan 25 mg daily. His blood pressure is elevated at 160/88 mmHg. He mentions consuming a lot of salted almonds recently.  He has hyperlipidemia and is on Crestor 20 mg daily. His most recent LDL cholesterol level is 66 mg/dL, which is at goal. He is also taking aspirin 81 mg daily as part of his treatment regimen.  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 He enjoys walking and walks for an hour on the two days he does not work. No chest pain or trouble breathing.          Problem List 1. CAD -coronary calcium score 745 (67th percentile) 2. HLD -T chol 123, HDL 41, LDL 66, TG 81 3. HTN    ROS: All other ROS reviewed and negative. Pertinent positives noted in the HPI.     Studies Reviewed: SABRA   EKG Interpretation Date/Time:  Monday August 12 2024 07:52:47 EST Ventricular Rate:  60 PR Interval:  178 QRS Duration:  72 QT Interval:  408 QTC Calculation: 408 R Axis:   21  Text Interpretation: Normal sinus rhythm Normal ECG Confirmed by Kevin Le [phone]) on 08/12/2024 7:56:32 AM   CT CAC 11/16/2020 IMPRESSION: 1. Coronary calcium score is 745 and this is at percentile 67  for patients of the same age, gender and ethnicity. 2.  Aortic Atherosclerosis (ICD10-I70.0). Physical Exam:   VS:  BP (!) 160/88 (BP Location: Right Arm, Patient Position: Sitting, Cuff Size: Normal)   Pulse 60   Ht 5' 9 (1.753 m)   Wt 148 lb 12.8 oz (67.5 kg)   SpO2 98%   BMI 21.97 kg/m    Wt Readings from Last 3 Encounters:  08/12/24 148 lb 12.8 oz (67.5 kg)  05/08/23 148 lb 12.8 oz (67.5 kg)  01/25/21 145 lb 3.2 oz (65.9 kg)    GEN: Well nourished, well developed in no acute distress NECK: No JVD; No carotid bruits CARDIAC: RRR, no murmurs, rubs, gallops RESPIRATORY:  Clear to auscultation without rales, wheezing or rhonchi  ABDOMEN: Soft, non-tender, non-distended EXTREMITIES:  No edema; No deformity  ASSESSMENT AND PLAN: .   Assessment and Plan    Hypertension Blood pressure elevated at 160/88 mmHg. Currently on losartan 25 mg daily. Possible dietary influence from high salt intake. - Start home blood pressure monitoring every few days. - Aim for blood pressure around 130/80 mmHg.  Coronary artery disease Abnormal coronary calcium score Elevated coronary calcium score of 745, 67th percentile. No current symptoms. On preventive measures including aspirin and Crestor. No current symptoms. EKG normal. On aspirin 81 mg daily  for secondary prevention. - Continue aspirin 81 mg daily.  Hyperlipidemia LDL cholesterol at goal with current treatment. On Crestor 20 mg daily. - Continue Crestor 20 mg daily.              Follow-up: Return in about 1 year (around 08/12/2025).  Signed, Kevin Le. Barbaraann, MD, [facility]  [facility]  [address] [place], [place] [address] [phone]  8:03 AM

## 2024-08-12 ENCOUNTER — Ambulatory Visit: Attending: Cardiovascular Disease | Admitting: Cardiovascular Disease

## 2024-08-12 ENCOUNTER — Encounter: Payer: Self-pay | Admitting: Cardiovascular Disease

## 2024-08-12 VITALS — BP 160/88 | HR 60 | Ht 69.0 in | Wt 148.8 lb

## 2024-08-12 DIAGNOSIS — E782 Mixed hyperlipidemia: Secondary | ICD-10-CM | POA: Insufficient documentation

## 2024-08-12 DIAGNOSIS — N1831 Chronic kidney disease, stage 3a: Secondary | ICD-10-CM | POA: Diagnosis not present

## 2024-08-12 DIAGNOSIS — R931 Abnormal findings on diagnostic imaging of heart and coronary circulation: Secondary | ICD-10-CM | POA: Diagnosis present

## 2024-08-12 NOTE — Patient Instructions (Signed)
 Medication Instructions:  Your physician recommends that you continue on your current medications as directed. Please refer to the Current Medication list given to you today.  *If you need a refill on your cardiac medications before your next appointment, please call your pharmacy*   Follow-Up: At [facility], you and your health needs are our priority.  As part of our continuing mission to provide you with exceptional heart care, our providers are all part of one team.  This team includes your primary Cardiologist (physician) and Advanced Practice Providers or APPs (Physician Assistants and Nurse Practitioners) who all work together to provide you with the care you need, when you need it.  Your next appointment:   1 year(s)  Provider:   One of our Advanced Practice Providers (APPs): Morse Clause, PA-C  Lamarr Satterfield, NP Miriam Shams, NP  Olivia Pavy, PA-C Josefa Beauvais, NP  Leontine Salen, PA-C Orren Fabry, PA-C  [place], PA-C Ernest Dick, NP  Damien Braver, NP Jon Hails, PA-C  Waddell Donath, PA-C    Dayna Dunn, PA-C  Scott Weaver, PA-C Lum Louis, NP Katlyn West, NP Callie Goodrich, PA-C  Xika Zhao, NP Sheng Haley, PA-C    Kathleen Johnson, PA-C       We recommend signing up for the patient portal called MyChart.  Sign up information is provided on this After Visit Summary.  MyChart is used to connect with patients for Virtual Visits (Telemedicine).  Patients are able to view lab/test results, encounter notes, upcoming appointments, etc.  Non-urgent messages can be sent to your provider as well.   To learn more about what you can do with MyChart, go to [email or web address].

## 2024-09-16 DIAGNOSIS — D1801 Hemangioma of skin and subcutaneous tissue: Secondary | ICD-10-CM | POA: Diagnosis not present

## 2024-09-16 DIAGNOSIS — L570 Actinic keratosis: Secondary | ICD-10-CM | POA: Diagnosis not present

## 2024-09-16 DIAGNOSIS — L821 Other seborrheic keratosis: Secondary | ICD-10-CM | POA: Diagnosis not present

## 2024-09-16 DIAGNOSIS — L812 Freckles: Secondary | ICD-10-CM | POA: Diagnosis not present

## 2024-09-16 DIAGNOSIS — L853 Xerosis cutis: Secondary | ICD-10-CM | POA: Diagnosis not present

## 2024-09-16 DIAGNOSIS — L309 Dermatitis, unspecified: Secondary | ICD-10-CM | POA: Diagnosis not present
